# Patient Record
Sex: Male | Born: 1940 | Race: White | Hispanic: No | Marital: Married | Smoking: Never smoker
Health system: Southern US, Community
[De-identification: ages and names within clinical notes are randomized; demographics above are authoritative.]

## PROBLEM LIST (undated history)

## (undated) DIAGNOSIS — I10 Essential (primary) hypertension: Secondary | ICD-10-CM

## (undated) DIAGNOSIS — M1990 Unspecified osteoarthritis, unspecified site: Secondary | ICD-10-CM

## (undated) DIAGNOSIS — C4490 Unspecified malignant neoplasm of skin, unspecified: Secondary | ICD-10-CM

## (undated) DIAGNOSIS — E7800 Pure hypercholesterolemia, unspecified: Secondary | ICD-10-CM

## (undated) DIAGNOSIS — E119 Type 2 diabetes mellitus without complications: Secondary | ICD-10-CM

## (undated) HISTORY — PX: COLON SURGERY: SHX602

## (undated) HISTORY — PX: EYE SURGERY: SHX253

## (undated) HISTORY — DX: Unspecified osteoarthritis, unspecified site: M19.90

---

## 2016-12-13 DIAGNOSIS — D485 Neoplasm of uncertain behavior of skin: Secondary | ICD-10-CM | POA: Diagnosis not present

## 2016-12-13 DIAGNOSIS — L570 Actinic keratosis: Secondary | ICD-10-CM | POA: Diagnosis not present

## 2016-12-13 DIAGNOSIS — Z85828 Personal history of other malignant neoplasm of skin: Secondary | ICD-10-CM | POA: Diagnosis not present

## 2016-12-21 DIAGNOSIS — H35372 Puckering of macula, left eye: Secondary | ICD-10-CM | POA: Diagnosis not present

## 2016-12-21 DIAGNOSIS — E113593 Type 2 diabetes mellitus with proliferative diabetic retinopathy without macular edema, bilateral: Secondary | ICD-10-CM | POA: Diagnosis not present

## 2016-12-21 DIAGNOSIS — H35371 Puckering of macula, right eye: Secondary | ICD-10-CM | POA: Diagnosis not present

## 2017-01-24 DIAGNOSIS — Z85828 Personal history of other malignant neoplasm of skin: Secondary | ICD-10-CM | POA: Diagnosis not present

## 2017-01-24 DIAGNOSIS — L570 Actinic keratosis: Secondary | ICD-10-CM | POA: Diagnosis not present

## 2017-01-24 DIAGNOSIS — Z79899 Other long term (current) drug therapy: Secondary | ICD-10-CM | POA: Diagnosis not present

## 2017-02-10 DIAGNOSIS — E1165 Type 2 diabetes mellitus with hyperglycemia: Secondary | ICD-10-CM | POA: Diagnosis not present

## 2017-02-10 DIAGNOSIS — E119 Type 2 diabetes mellitus without complications: Secondary | ICD-10-CM | POA: Diagnosis not present

## 2017-02-10 DIAGNOSIS — E785 Hyperlipidemia, unspecified: Secondary | ICD-10-CM | POA: Diagnosis not present

## 2017-02-10 DIAGNOSIS — I10 Essential (primary) hypertension: Secondary | ICD-10-CM | POA: Diagnosis not present

## 2017-06-14 DIAGNOSIS — H35372 Puckering of macula, left eye: Secondary | ICD-10-CM | POA: Diagnosis not present

## 2017-06-14 DIAGNOSIS — H35371 Puckering of macula, right eye: Secondary | ICD-10-CM | POA: Diagnosis not present

## 2017-06-14 DIAGNOSIS — E113591 Type 2 diabetes mellitus with proliferative diabetic retinopathy without macular edema, right eye: Secondary | ICD-10-CM | POA: Diagnosis not present

## 2017-06-14 DIAGNOSIS — H35073 Retinal telangiectasis, bilateral: Secondary | ICD-10-CM | POA: Diagnosis not present

## 2017-06-14 DIAGNOSIS — E113512 Type 2 diabetes mellitus with proliferative diabetic retinopathy with macular edema, left eye: Secondary | ICD-10-CM | POA: Diagnosis not present

## 2017-07-28 DIAGNOSIS — Z1283 Encounter for screening for malignant neoplasm of skin: Secondary | ICD-10-CM | POA: Diagnosis not present

## 2017-07-28 DIAGNOSIS — D225 Melanocytic nevi of trunk: Secondary | ICD-10-CM | POA: Diagnosis not present

## 2017-07-28 DIAGNOSIS — L821 Other seborrheic keratosis: Secondary | ICD-10-CM | POA: Diagnosis not present

## 2017-07-28 DIAGNOSIS — L570 Actinic keratosis: Secondary | ICD-10-CM | POA: Diagnosis not present

## 2017-08-15 DIAGNOSIS — E119 Type 2 diabetes mellitus without complications: Secondary | ICD-10-CM | POA: Diagnosis not present

## 2017-08-15 DIAGNOSIS — H35371 Puckering of macula, right eye: Secondary | ICD-10-CM | POA: Diagnosis not present

## 2017-08-15 DIAGNOSIS — H26493 Other secondary cataract, bilateral: Secondary | ICD-10-CM | POA: Diagnosis not present

## 2017-08-15 DIAGNOSIS — Z961 Presence of intraocular lens: Secondary | ICD-10-CM | POA: Diagnosis not present

## 2017-08-25 DIAGNOSIS — E785 Hyperlipidemia, unspecified: Secondary | ICD-10-CM | POA: Diagnosis not present

## 2017-08-25 DIAGNOSIS — Z23 Encounter for immunization: Secondary | ICD-10-CM | POA: Diagnosis not present

## 2017-08-25 DIAGNOSIS — E1165 Type 2 diabetes mellitus with hyperglycemia: Secondary | ICD-10-CM | POA: Diagnosis not present

## 2017-08-25 DIAGNOSIS — E119 Type 2 diabetes mellitus without complications: Secondary | ICD-10-CM | POA: Diagnosis not present

## 2017-08-25 DIAGNOSIS — Z0000 Encounter for general adult medical examination without abnormal findings: Secondary | ICD-10-CM | POA: Diagnosis not present

## 2017-08-25 DIAGNOSIS — Z125 Encounter for screening for malignant neoplasm of prostate: Secondary | ICD-10-CM | POA: Diagnosis not present

## 2017-11-19 ENCOUNTER — Emergency Department: Payer: Medicare Other

## 2017-11-19 ENCOUNTER — Encounter: Payer: Self-pay | Admitting: Emergency Medicine

## 2017-11-19 ENCOUNTER — Other Ambulatory Visit: Payer: Self-pay

## 2017-11-19 ENCOUNTER — Emergency Department
Admission: EM | Admit: 2017-11-19 | Discharge: 2017-11-19 | Disposition: A | Discharge status: Home / Self Care | Payer: Medicare Other | Attending: Emergency Medicine | Admitting: Emergency Medicine

## 2017-11-19 DIAGNOSIS — I10 Essential (primary) hypertension: Secondary | ICD-10-CM | POA: Insufficient documentation

## 2017-11-19 DIAGNOSIS — R110 Nausea: Secondary | ICD-10-CM | POA: Diagnosis not present

## 2017-11-19 DIAGNOSIS — E119 Type 2 diabetes mellitus without complications: Secondary | ICD-10-CM | POA: Diagnosis not present

## 2017-11-19 DIAGNOSIS — R404 Transient alteration of awareness: Secondary | ICD-10-CM | POA: Diagnosis not present

## 2017-11-19 DIAGNOSIS — R42 Dizziness and giddiness: Secondary | ICD-10-CM | POA: Diagnosis not present

## 2017-11-19 HISTORY — DX: Type 2 diabetes mellitus without complications: E11.9

## 2017-11-19 HISTORY — DX: Essential (primary) hypertension: I10

## 2017-11-19 HISTORY — DX: Pure hypercholesterolemia, unspecified: E78.00

## 2017-11-19 HISTORY — DX: Unspecified malignant neoplasm of skin, unspecified: C44.90

## 2017-11-19 LAB — URINALYSIS, COMPLETE (UACMP) WITH MICROSCOPIC
Bacteria, UA: NONE SEEN
Bilirubin Urine: NEGATIVE
GLUCOSE, UA: 50 mg/dL — AB
HGB URINE DIPSTICK: NEGATIVE
Ketones, ur: NEGATIVE mg/dL
Leukocytes, UA: NEGATIVE
NITRITE: NEGATIVE
PH: 7 (ref 5.0–8.0)
PROTEIN: NEGATIVE mg/dL
RBC / HPF: NONE SEEN RBC/hpf (ref 0–5)
SPECIFIC GRAVITY, URINE: 1.005 (ref 1.005–1.030)
Squamous Epithelial / LPF: NONE SEEN

## 2017-11-19 LAB — BASIC METABOLIC PANEL
ANION GAP: 9 (ref 5–15)
BUN: 12 mg/dL (ref 6–20)
CALCIUM: 9.1 mg/dL (ref 8.9–10.3)
CO2: 26 mmol/L (ref 22–32)
CREATININE: 0.84 mg/dL (ref 0.61–1.24)
Chloride: 96 mmol/L — ABNORMAL LOW (ref 101–111)
GLUCOSE: 187 mg/dL — AB (ref 65–99)
Potassium: 4.2 mmol/L (ref 3.5–5.1)
Sodium: 131 mmol/L — ABNORMAL LOW (ref 135–145)

## 2017-11-19 LAB — CBC
HCT: 38.1 % — ABNORMAL LOW (ref 40.0–52.0)
HEMOGLOBIN: 12.9 g/dL — AB (ref 13.0–18.0)
MCH: 29.5 pg (ref 26.0–34.0)
MCHC: 33.9 g/dL (ref 32.0–36.0)
MCV: 86.9 fL (ref 80.0–100.0)
PLATELETS: 212 10*3/uL (ref 150–440)
RBC: 4.38 MIL/uL — AB (ref 4.40–5.90)
RDW: 13 % (ref 11.5–14.5)
WBC: 10.1 10*3/uL (ref 3.8–10.6)

## 2017-11-19 LAB — TROPONIN I: Troponin I: <0.03 ng/mL (ref <0.03)

## 2017-11-19 NOTE — Discharge Instructions (Signed)
The exact cause of your episode is unclear.  It may have been an episode of vertigo caused by her middle ear.  Return to the emergency department immediately for new, worsening, recurrent dizziness, weakness or lightheadedness, headache, severe nausea or vomiting, fevers, generalized weakness, vision changes, acute hearing loss, or any other new or worsening symptoms that concern you.  Follow-up with your primary care doctor within the next week.

## 2017-11-19 NOTE — ED Triage Notes (Signed)
Acute onset of dizziness that has resolved since started.  Described as lightheaded. Has had generalized tremors. No weakness.  No droop. Speech clear.

## 2017-11-19 NOTE — ED Provider Notes (Signed)
[facility] Emergency Department Provider Note ____________________________________________   First MD Initiated Contact with Patient 11/19/17 1841     (approximate)  I have reviewed the triage vital signs and the nursing notes.   HISTORY  Chief Complaint Dizziness    HPI Alexander Peterson is a 77 y.o. male with past medical history as noted below who presents with an acute episode of dizziness, occurring while he was watching TV and sitting, preceded by feeling a "whooshing" sound in his left ear for a few seconds, and then having severe dizziness that he described as feeling "woozy."  The patient states that he may have felt lightheaded, but did not feel like he was going to pass out.  He also described a movement or spinning sensation to me.  He states that the symptoms started to resolve once EMS arrived, and now have completely resolved.  Patient reports associated generalized shaking and tremor, which is also now resolved.  The patient denies any previous history of similar episodes.  He denies any chest pain or difficulty breathing, headache, vomiting or diarrhea, or any weakness or numbness.  He states he did have some nausea.   Past Medical History:  Diagnosis Date  . Diabetes mellitus without complication ([place])   . Hypercholesteremia   . Hypertension   . Skin cancer     There are no active problems to display for this patient.   History reviewed. No pertinent surgical history.  Prior to Admission medications   Not on File    Allergies Patient has no known allergies.  History reviewed. No pertinent family history.  Social History Social History   Tobacco Use  . Smoking status: Never Smoker  . Smokeless tobacco: Never Used  Substance Use Topics  . Alcohol use: No    Frequency: Never  . Drug use: No    Review of Systems  Constitutional: No fever/chills. Eyes: No visual changes. ENT: Negative for ear pain Cardiovascular: Denies  chest pain. Respiratory: Denies shortness of breath. Gastrointestinal: Positive for nausea, no vomiting.  No diarrhea.  Genitourinary: Negative for dysuria.  Musculoskeletal: Negative for back pain. Skin: Negative for rash. Neurological: Negative for headache.   ____________________________________________   PHYSICAL EXAM:  VITAL SIGNS: ED Triage Vitals [11/19/17 1826]  Enc Vitals Group     BP      Pulse      Resp      Temp      Temp src      SpO2      Weight 195 lb (88.5 kg)     Height 5\' 10"  (1.778 m)     Head Circumference      Peak Flow      Pain Score      Pain Loc      Pain Edu?      Excl. in [place]?     Constitutional: Alert and oriented. Well appearing for age and in no acute distress. Eyes: Conjunctivae are normal.  EOMI.  PERRLA. Head: Atraumatic.  TMs appear normal bilaterally. Nose: No congestion/rhinnorhea. Mouth/Throat: Mucous membranes are moist.   Neck: Normal range of motion.  Cardiovascular: Normal rate, regular rhythm. Grossly normal heart sounds.  Good peripheral circulation. Respiratory: Normal respiratory effort.  No retractions. Lungs CTAB. Gastrointestinal: Soft and nontender. No distention.  Genitourinary: No flank tenderness. Musculoskeletal: No lower extremity edema.  Extremities warm and well perfused.  Neurologic:  Normal speech and language.  Motor and sensory intact in all extremities.  Cranial nerves  III through XII intact.  Normal coordination with no ataxia on finger to nose.   Skin:  Skin is warm and dry. No rash noted. Psychiatric: Mood and affect are normal. Speech and behavior are normal.  ____________________________________________   LABS (all labs ordered are listed, but only abnormal results are displayed)  Labs Reviewed  BASIC METABOLIC PANEL - Abnormal; Notable for the following components:      Result Value   Sodium 131 (*)    Chloride 96 (*)    Glucose, Bld 187 (*)    All other components within normal limits  CBC -  Abnormal; Notable for the following components:   RBC 4.38 (*)    Hemoglobin 12.9 (*)    HCT 38.1 (*)    All other components within normal limits  URINALYSIS, COMPLETE (UACMP) WITH MICROSCOPIC - Abnormal; Notable for the following components:   Color, Urine STRAW (*)    APPearance CLEAR (*)    Glucose, UA 50 (*)    All other components within normal limits  TROPONIN I   ____________________________________________  EKG  ED ECG REPORT I, Arta Silence, the attending physician, personally viewed and interpreted this ECG.  Date: 11/19/2017 EKG Time: 1836 Rate: 82 Rhythm: normal sinus rhythm QRS Axis: normal Intervals: Incomplete right bundle branch block ST/T Wave abnormalities: normal Narrative Interpretation: no evidence of acute ischemia; no old EKG available for comparison  ____________________________________________  RADIOLOGY  CT head: No ICH or other acute abnormalities  ____________________________________________   PROCEDURES  Procedure(s) performed: No  Procedures  Critical Care performed: No ____________________________________________   INITIAL IMPRESSION / ASSESSMENT AND PLAN / ED COURSE  Pertinent labs & imaging results that were available during my care of the patient were reviewed by me and considered in my medical decision making (see chart for details).  77 year old male with past medical history as noted above presents with an acute episode of dizziness described both as having a lightheaded or woozy component, as well as a movement sensation.  He also reports generalized tremors.  The symptoms have since resolved.  His blood pressure was noted during transport to be as high as 854 systolic and is still somewhat elevated here, although it was 627 systolic when EMS first checked it.  Past medical records reviewed in Epic and are noncontributory.  On exam, patient is well-appearing, vital signs are normal other than the elevated blood  pressure, the neuro exam is nonfocal, and the remainder the exam is unremarkable.  Overall, patient's presentation does not fit any specific etiology.  It is possible that this could be an atypical episode of peripheral vertigo, given the abnormal sound in the ear prior to it, although it was not associated with motion, and that the associated symptoms do not fit.  It also does not appear consistent with a near syncope.  No evidence of seizure, the patient was conscious throughout the event.  I have a low suspicion for cardiac etiology.  Given that the symptoms have completely resolved and the patient has no headache, there is no evidence of SAH.  No current neuro deficits or strokelike symptoms.  Plan: Basic and cardiac labs, CT head, and reassess.  If negative workup and patient continues to be asymptomatic, anticipate discharge home.    ----------------------------------------- 8:22 PM on 11/19/2017 -----------------------------------------  Lab workup is unremarkable.  Borderline low sodium and chloride are normal for patient.  CT head is negative.  The patient has had no recurrence of his symptoms in the ED, and  his blood pressure has improved on its own.  No evidence of acute CNS cause, or indication for further ED observation or workup.  The patient himself very much would like to go home.  Return precautions given, the patient expresses understanding.  He agrees to follow-up with his primary care doctor.  ____________________________________________   FINAL CLINICAL IMPRESSION(S) / ED DIAGNOSES  Final diagnoses:  Dizziness      NEW MEDICATIONS STARTED DURING THIS VISIT:  New Prescriptions   No medications on file     Note:  This document was prepared using Dragon voice recognition software and may include unintentional dictation errors.    Arta Silence, MD 11/19/17 2023

## 2017-11-22 DIAGNOSIS — R42 Dizziness and giddiness: Secondary | ICD-10-CM | POA: Diagnosis not present

## 2017-11-24 ENCOUNTER — Telehealth: Payer: Self-pay

## 2017-11-24 NOTE — Telephone Encounter (Signed)
[facility] and [facility] for we need a referral sent over for patient. We can't do exams until we see patient .

## 2017-12-01 NOTE — Telephone Encounter (Signed)
[facility] office aware to send new patient referral to schedule new patient appt prior to testing awaiting referral

## 2017-12-06 ENCOUNTER — Ambulatory Visit
Admission: RE | Admit: 2017-12-06 | Discharge: 2017-12-06 | Disposition: A | Discharge status: Home / Self Care | Payer: Medicare Other | Source: Ambulatory Visit | Attending: Family Medicine | Admitting: Family Medicine

## 2017-12-06 ENCOUNTER — Other Ambulatory Visit: Payer: Self-pay | Admitting: Family Medicine

## 2017-12-06 ENCOUNTER — Other Ambulatory Visit: Payer: Medicare Other

## 2017-12-06 ENCOUNTER — Ambulatory Visit (HOSPITAL_BASED_OUTPATIENT_CLINIC_OR_DEPARTMENT_OTHER)
Admission: RE | Admit: 2017-12-06 | Discharge: 2017-12-06 | Disposition: A | Discharge status: Home / Self Care | Payer: Medicare Other | Source: Ambulatory Visit | Admitting: *Deleted

## 2017-12-06 DIAGNOSIS — R55 Syncope and collapse: Secondary | ICD-10-CM

## 2017-12-06 DIAGNOSIS — E119 Type 2 diabetes mellitus without complications: Secondary | ICD-10-CM | POA: Diagnosis not present

## 2017-12-06 DIAGNOSIS — I119 Hypertensive heart disease without heart failure: Secondary | ICD-10-CM | POA: Insufficient documentation

## 2017-12-06 DIAGNOSIS — E7800 Pure hypercholesterolemia, unspecified: Secondary | ICD-10-CM | POA: Insufficient documentation

## 2017-12-06 DIAGNOSIS — I340 Nonrheumatic mitral (valve) insufficiency: Secondary | ICD-10-CM | POA: Diagnosis not present

## 2017-12-06 NOTE — Progress Notes (Signed)
*  PRELIMINARY RESULTS* Echocardiogram 2D Echocardiogram has been performed.  Alexander Peterson 12/06/2017, 2:43 PM

## 2017-12-27 DIAGNOSIS — E113512 Type 2 diabetes mellitus with proliferative diabetic retinopathy with macular edema, left eye: Secondary | ICD-10-CM | POA: Diagnosis not present

## 2017-12-27 DIAGNOSIS — E113591 Type 2 diabetes mellitus with proliferative diabetic retinopathy without macular edema, right eye: Secondary | ICD-10-CM | POA: Diagnosis not present

## 2017-12-27 DIAGNOSIS — H35373 Puckering of macula, bilateral: Secondary | ICD-10-CM | POA: Diagnosis not present

## 2017-12-27 DIAGNOSIS — H35073 Retinal telangiectasis, bilateral: Secondary | ICD-10-CM | POA: Diagnosis not present

## 2017-12-28 DIAGNOSIS — R55 Syncope and collapse: Secondary | ICD-10-CM | POA: Diagnosis not present

## 2017-12-28 DIAGNOSIS — R42 Dizziness and giddiness: Secondary | ICD-10-CM | POA: Diagnosis not present

## 2018-02-20 ENCOUNTER — Emergency Department
Admission: EM | Admit: 2018-02-20 | Discharge: 2018-02-20 | Disposition: A | Discharge status: Home / Self Care | Payer: Medicare Other | Attending: Emergency Medicine | Admitting: Emergency Medicine

## 2018-02-20 ENCOUNTER — Other Ambulatory Visit: Payer: Self-pay

## 2018-02-20 ENCOUNTER — Emergency Department: Payer: Medicare Other

## 2018-02-20 DIAGNOSIS — J3489 Other specified disorders of nose and nasal sinuses: Secondary | ICD-10-CM | POA: Diagnosis not present

## 2018-02-20 DIAGNOSIS — R42 Dizziness and giddiness: Secondary | ICD-10-CM | POA: Insufficient documentation

## 2018-02-20 DIAGNOSIS — I10 Essential (primary) hypertension: Secondary | ICD-10-CM | POA: Diagnosis not present

## 2018-02-20 DIAGNOSIS — R531 Weakness: Secondary | ICD-10-CM

## 2018-02-20 DIAGNOSIS — E871 Hypo-osmolality and hyponatremia: Secondary | ICD-10-CM | POA: Insufficient documentation

## 2018-02-20 DIAGNOSIS — R05 Cough: Secondary | ICD-10-CM | POA: Diagnosis not present

## 2018-02-20 DIAGNOSIS — E119 Type 2 diabetes mellitus without complications: Secondary | ICD-10-CM | POA: Diagnosis not present

## 2018-02-20 LAB — CBC
HCT: 38.5 % — ABNORMAL LOW (ref 40.0–52.0)
Hemoglobin: 13.1 g/dL (ref 13.0–18.0)
MCH: 29.8 pg (ref 26.0–34.0)
MCHC: 34 g/dL (ref 32.0–36.0)
MCV: 87.5 fL (ref 80.0–100.0)
PLATELETS: 198 10*3/uL (ref 150–440)
RBC: 4.41 MIL/uL (ref 4.40–5.90)
RDW: 13.4 % (ref 11.5–14.5)
WBC: 9.5 10*3/uL (ref 3.8–10.6)

## 2018-02-20 LAB — COMPREHENSIVE METABOLIC PANEL
ALT: 15 U/L — AB (ref 17–63)
AST: 26 U/L (ref 15–41)
Albumin: 4 g/dL (ref 3.5–5.0)
Alkaline Phosphatase: 49 U/L (ref 38–126)
Anion gap: 9 (ref 5–15)
BUN: 8 mg/dL (ref 6–20)
CHLORIDE: 91 mmol/L — AB (ref 101–111)
CO2: 26 mmol/L (ref 22–32)
CREATININE: 0.83 mg/dL (ref 0.61–1.24)
Calcium: 8.8 mg/dL — ABNORMAL LOW (ref 8.9–10.3)
GFR calc non Af Amer: >60 mL/min (ref >60)
Glucose, Bld: 192 mg/dL — ABNORMAL HIGH (ref 65–99)
Potassium: 4.4 mmol/L (ref 3.5–5.1)
SODIUM: 126 mmol/L — AB (ref 135–145)
Total Bilirubin: 0.5 mg/dL (ref 0.3–1.2)
Total Protein: 8.1 g/dL (ref 6.5–8.1)

## 2018-02-20 LAB — TROPONIN I: Troponin I: <0.03 ng/mL (ref <0.03)

## 2018-02-20 MED ORDER — AZITHROMYCIN 250 MG PO TABS: 250.0000 mg | ORAL_TABLET | Freq: Every day | ORAL | 0 refills | Status: DC

## 2018-02-20 MED ORDER — AZITHROMYCIN 500 MG PO TABS
500.0000 mg | ORAL_TABLET | Freq: Once | ORAL | Status: AC
  Administered 2018-02-20: 500 mg via ORAL
  Filled 2018-02-20: qty 1

## 2018-02-20 MED ORDER — SODIUM CHLORIDE 0.9 % IV BOLUS
1000.0000 mL | Freq: Once | INTRAVENOUS | Status: AC
  Administered 2018-02-20: 1000 mL via INTRAVENOUS

## 2018-02-20 NOTE — ED Notes (Signed)
Pt up to bedside commode w/o difficulty

## 2018-02-20 NOTE — ED Provider Notes (Signed)
[facility] Emergency Department Provider Note   ____________________________________________   First MD Initiated Contact with Patient 02/20/18 0543     (approximate)  I have reviewed the triage vital signs and the nursing notes.   HISTORY  Chief Complaint Weakness and Dizziness    HPI Alexander Peterson is a 77 y.o. male brought to the ED from home via EMS with a chief complaint of generalized weakness, dizziness and sinus drainage.  Patient reports he awoke at 3 AM to start his day.  Was making his coffee and felt generally weak.  Felt dizzy and dry heaves.  Has been having sinus drainage and nonproductive cough for a week.  Denies associated fever, chills, chest pain, shortness of breath, abdominal pain, vomiting, dysuria, diarrhea.  Denies recent travel or trauma.   Past Medical History:  Diagnosis Date  . Diabetes mellitus without complication ([place])   . Hypercholesteremia   . Hypertension   . Skin cancer     There are no active problems to display for this patient.   History reviewed. No pertinent surgical history.  Prior to Admission medications   Medication Sig Start Date End Date Taking? Authorizing Provider  azithromycin (ZITHROMAX) 250 MG tablet Take 1 tablet (250 mg total) by mouth daily. 02/20/18   Paulette Blanch, MD    Allergies Patient has no known allergies.  No family history on file.  Social History Social History   Tobacco Use  . Smoking status: Never Smoker  . Smokeless tobacco: Never Used  Substance Use Topics  . Alcohol use: No    Frequency: Never  . Drug use: No    Review of Systems  Constitutional: Positive for generalized weakness.  No fever/chills Eyes: No visual changes. ENT: Positive for sinus drainage.  No sore throat. Cardiovascular: Denies chest pain. Respiratory: Positive for nonproductive cough.  Denies shortness of breath. Gastrointestinal: No abdominal pain.  No nausea, no vomiting.  No diarrhea.  No  constipation. Genitourinary: Negative for dysuria. Musculoskeletal: Negative for back pain. Skin: Negative for rash. Neurological: Negative for headaches, focal weakness or numbness.   ____________________________________________   PHYSICAL EXAM:  VITAL SIGNS: ED Triage Vitals  Enc Vitals Group     BP 02/20/18 0440 (!) 162/83     Pulse Rate 02/20/18 0440 (!) 102     Resp 02/20/18 0440 18     Temp 02/20/18 0440 99.1 F (37.3 C)     Temp src --      SpO2 02/20/18 0440 97 %     Weight 02/20/18 0442 194 lb (88 kg)     Height 02/20/18 0442 5\' 10"  (1.778 m)     Head Circumference --      Peak Flow --      Pain Score 02/20/18 0441 6     Pain Loc --      Pain Edu? --      Excl. in [place]? --     Constitutional: Alert and oriented. Well appearing and in no acute distress. Eyes: Conjunctivae are normal. PERRL. EOMI. Head: Atraumatic. Ears: Mild fluid behind both TMs. Nose: No congestion/rhinnorhea. Mouth/Throat: Mucous membranes are moist.  Oropharynx non-erythematous. Neck: No stridor.  No carotid bruits. Cardiovascular: Normal rate, regular rhythm. Grossly normal heart sounds.  Good peripheral circulation. Respiratory: Normal respiratory effort.  No retractions. Lungs CTAB. Gastrointestinal: Soft and nontender. No distention. No abdominal bruits. No CVA tenderness. Musculoskeletal: No lower extremity tenderness nor edema.  No joint effusions. Neurologic:  Normal speech and  language. No gross focal neurologic deficits are appreciated. No gait instability. Skin:  Skin is warm, dry and intact. No rash noted. Psychiatric: Mood and affect are normal. Speech and behavior are normal.  ____________________________________________   LABS (all labs ordered are listed, but only abnormal results are displayed)  Labs Reviewed  CBC - Abnormal; Notable for the following components:      Result Value   HCT 38.5 (*)    All other components within normal limits  COMPREHENSIVE METABOLIC  PANEL - Abnormal; Notable for the following components:   Sodium 126 (*)    Chloride 91 (*)    Glucose, Bld 192 (*)    Calcium 8.8 (*)    ALT 15 (*)    All other components within normal limits  TROPONIN I  TROPONIN I   ____________________________________________  EKG  ED ECG REPORT I, Quantae Martel J, the attending physician, personally viewed and interpreted this ECG.   Date: 02/20/2018  EKG Time: 0447  Rate: 102  Rhythm: sinus tachycardia  Axis: Normal  Intervals:right bundle branch block  ST&T Change: Nonspecific  ____________________________________________  RADIOLOGY  ED MD interpretation: No acute cardiopulmonary process  Official radiology report(s): Dg Chest 2 View  Result Date: 02/20/2018 CLINICAL DATA:  Weakness, dizziness, and sinus drainage for a week. Cough and fever. EXAM: CHEST - 2 VIEW COMPARISON:  None. FINDINGS: Normal heart size and pulmonary vascularity. No focal airspace disease or consolidation in the lungs. No blunting of costophrenic angles. No pneumothorax. Mediastinal contours appear intact. Calcification of the aorta. Degenerative changes in the spine. IMPRESSION: No evidence of active pulmonary disease.  Aortic atherosclerosis. Electronically Signed   By: Lucienne Capers M.D.   On: 02/20/2018 05:09    ____________________________________________   PROCEDURES  Procedure(s) performed: None  Procedures  Critical Care performed: No  ____________________________________________   INITIAL IMPRESSION / ASSESSMENT AND PLAN / ED COURSE  As part of my medical decision making, I reviewed the following data within the electronic MEDICAL RECORD NUMBER History obtained from family, Nursing notes reviewed and incorporated, Labs reviewed, EKG interpreted, Radiograph reviewed and Notes from prior ED visits   77 year old male with diabetes, hypertension, history of hyponatremia who presents with generalized weakness, sinus drainage, dizziness and nausea.   Differential diagnosis includes but is not limited to hyponatremia, dehydration, infection, etc.  Laboratory results remarkable for hyponatremia which is lower than prior.  Will obtain orthostatic vital signs, initiate IV fluids and reassess.   Clinical Course as of Feb 21 716  Mon Feb 20, 2018  6962 Patient orthostatic by pulse rate.  He did not feel dizzy upon standing.  IV fluids infusing.  Will check timed troponin.   [JS]  0708 IV fluids infusing.  Timed troponin to be rechecked.  Anticipate discharge home if troponin is unremarkable and patient is not tachycardic or dizzy on standing.  I think it would be reasonable to discharge him on a Z-Pak given his sinus issues this week.  Care transferred to Dr. Cherylann Banas pending reassessment and disposition.   [JS]    Clinical Course User Index [JS] Paulette Blanch, MD     ____________________________________________   FINAL CLINICAL IMPRESSION(S) / ED DIAGNOSES  Final diagnoses:  Generalized weakness  Hyponatremia  Dizziness  Sinus drainage     ED Discharge Orders        Ordered    azithromycin (ZITHROMAX) 250 MG tablet  Daily     02/20/18 0711       Note:  This document  was prepared using Systems analyst and may include unintentional dictation errors.    Paulette Blanch, MD 02/20/18 [phone]

## 2018-02-20 NOTE — ED Provider Notes (Signed)
-----------------------------------------   9:01 AM on 02/20/2018 -----------------------------------------  I took over care of this patient with Dr. Beather Arbour.  Plan was to await repeat troponin and likely discharge home.  Repeat troponin is negative.  The patient stood and walked around and no longer has dizziness.  He feels well and would like to go home.  A prescription has been provided by Dr. Beather Arbour.  Return precautions given, the patient expresses understanding.   Arta Silence, MD 02/20/18 [phone]

## 2018-02-20 NOTE — ED Triage Notes (Signed)
Patient to ED via [facility] from home for complaints of weakness, dizzy and sinus drainage for a week. Patient is alert and oriented to place, time and events.

## 2018-02-20 NOTE — Discharge Instructions (Addendum)
1.  Finish antibiotic as prescribed (Azithromycin 250 mg daily x4 days).  Start your next dose Tuesday morning. 2.  Drink plenty of fluids daily. 3.  Return to the ER for worsening symptoms, persistent vomiting, difficulty breathing or other concerns.

## 2018-02-22 DIAGNOSIS — E785 Hyperlipidemia, unspecified: Secondary | ICD-10-CM | POA: Diagnosis not present

## 2018-02-22 DIAGNOSIS — I10 Essential (primary) hypertension: Secondary | ICD-10-CM | POA: Diagnosis not present

## 2018-02-22 DIAGNOSIS — E119 Type 2 diabetes mellitus without complications: Secondary | ICD-10-CM | POA: Diagnosis not present

## 2018-03-02 ENCOUNTER — Emergency Department
Admission: EM | Admit: 2018-03-02 | Discharge: 2018-03-02 | Disposition: A | Discharge status: Home / Self Care | Payer: Medicare Other | Attending: Emergency Medicine | Admitting: Emergency Medicine

## 2018-03-02 ENCOUNTER — Encounter: Payer: Self-pay | Admitting: Emergency Medicine

## 2018-03-02 ENCOUNTER — Other Ambulatory Visit: Payer: Self-pay

## 2018-03-02 DIAGNOSIS — Z7982 Long term (current) use of aspirin: Secondary | ICD-10-CM | POA: Diagnosis not present

## 2018-03-02 DIAGNOSIS — Z79899 Other long term (current) drug therapy: Secondary | ICD-10-CM | POA: Insufficient documentation

## 2018-03-02 DIAGNOSIS — I10 Essential (primary) hypertension: Secondary | ICD-10-CM | POA: Diagnosis not present

## 2018-03-02 DIAGNOSIS — R5383 Other fatigue: Secondary | ICD-10-CM | POA: Diagnosis present

## 2018-03-02 DIAGNOSIS — Z794 Long term (current) use of insulin: Secondary | ICD-10-CM | POA: Insufficient documentation

## 2018-03-02 DIAGNOSIS — E119 Type 2 diabetes mellitus without complications: Secondary | ICD-10-CM | POA: Diagnosis not present

## 2018-03-02 DIAGNOSIS — E860 Dehydration: Secondary | ICD-10-CM | POA: Diagnosis not present

## 2018-03-02 DIAGNOSIS — E871 Hypo-osmolality and hyponatremia: Secondary | ICD-10-CM | POA: Diagnosis not present

## 2018-03-02 DIAGNOSIS — E870 Hyperosmolality and hypernatremia: Secondary | ICD-10-CM | POA: Diagnosis not present

## 2018-03-02 LAB — BASIC METABOLIC PANEL
ANION GAP: 11 (ref 5–15)
Anion gap: 9 (ref 5–15)
BUN: 10 mg/dL (ref 6–20)
BUN: 10 mg/dL (ref 6–20)
CALCIUM: 8.2 mg/dL — AB (ref 8.9–10.3)
CHLORIDE: 89 mmol/L — AB (ref 101–111)
CO2: 24 mmol/L (ref 22–32)
CO2: 24 mmol/L (ref 22–32)
CREATININE: 0.91 mg/dL (ref 0.61–1.24)
CREATININE: 0.86 mg/dL (ref 0.61–1.24)
Calcium: 8.8 mg/dL — ABNORMAL LOW (ref 8.9–10.3)
Chloride: 92 mmol/L — ABNORMAL LOW (ref 101–111)
GFR calc Af Amer: >60 mL/min (ref >60)
GFR calc non Af Amer: >60 mL/min (ref >60)
Glucose, Bld: 187 mg/dL — ABNORMAL HIGH (ref 65–99)
Glucose, Bld: 152 mg/dL — ABNORMAL HIGH (ref 65–99)
Potassium: 4.2 mmol/L (ref 3.5–5.1)
Potassium: 4.1 mmol/L (ref 3.5–5.1)
SODIUM: 124 mmol/L — AB (ref 135–145)
SODIUM: 125 mmol/L — AB (ref 135–145)

## 2018-03-02 LAB — URINALYSIS, COMPLETE (UACMP) WITH MICROSCOPIC
Bacteria, UA: NONE SEEN
Bilirubin Urine: NEGATIVE
Hgb urine dipstick: NEGATIVE
KETONES UR: NEGATIVE mg/dL
Leukocytes, UA: NEGATIVE
Nitrite: NEGATIVE
PH: 8 (ref 5.0–8.0)
Protein, ur: NEGATIVE mg/dL
Specific Gravity, Urine: 1.003 — ABNORMAL LOW (ref 1.005–1.030)
Squamous Epithelial / LPF: NONE SEEN (ref 0–5)
WBC, UA: NONE SEEN WBC/hpf (ref 0–5)

## 2018-03-02 LAB — CBC
HCT: 36.6 % — ABNORMAL LOW (ref 40.0–52.0)
HEMOGLOBIN: 12.6 g/dL — AB (ref 13.0–18.0)
MCH: 29.6 pg (ref 26.0–34.0)
MCHC: 34.6 g/dL (ref 32.0–36.0)
MCV: 85.7 fL (ref 80.0–100.0)
Platelets: 325 10*3/uL (ref 150–440)
RBC: 4.27 MIL/uL — AB (ref 4.40–5.90)
RDW: 13 % (ref 11.5–14.5)
WBC: 10.3 10*3/uL (ref 3.8–10.6)

## 2018-03-02 LAB — CORTISOL: Cortisol, Plasma: 10.1 ug/dL

## 2018-03-02 LAB — MAGNESIUM: MAGNESIUM: 1.8 mg/dL (ref 1.7–2.4)

## 2018-03-02 LAB — URIC ACID: URIC ACID, SERUM: 3 mg/dL — AB (ref 4.4–7.6)

## 2018-03-02 LAB — CREATININE, URINE, RANDOM: Creatinine, Urine: 28 mg/dL

## 2018-03-02 LAB — TSH: TSH: 0.636 u[IU]/mL (ref 0.350–4.500)

## 2018-03-02 LAB — SODIUM, URINE, RANDOM: Sodium, Ur: 32 mmol/L

## 2018-03-02 MED ORDER — SODIUM CHLORIDE 0.9 % IV BOLUS
500.0000 mL | Freq: Once | INTRAVENOUS | Status: AC
  Administered 2018-03-02: 500 mL via INTRAVENOUS

## 2018-03-02 NOTE — ED Notes (Signed)
Called and spoke with Lamont in lab and he stated they would run urine at this time.

## 2018-03-02 NOTE — ED Notes (Signed)
Pt given urinal for urine collection 

## 2018-03-02 NOTE — ED Provider Notes (Signed)
[facility] Department Provider Note   ____________________________________________   First MD Initiated Contact with Patient 03/02/18 1504     (approximate)  I have reviewed the triage vital signs and the nursing notes.   HISTORY  Chief Complaint Dehydration   HPI Alexander Peterson is a 77 y.o. male history of diabetes elevated cholesterol and hypertension  Patient reports on Memorial Day became very dehydrated, had a sinus infection was not eating and drinking well.  He came to the ER and his sodium was low and he had to be hydrated.  He is followed up with his primary care doctor to have his sodium rechecked, they checked a couple days ago and it was in the low 120s and today they rechecked it was 121.  Reports he is continue to hydrate, drinking Gatorade, eating and drinking fluids normally.  He does not feel "dehydrated" anymore.  He does feel fatigue.  No muscle twitches or shakes.  No confusion.  No seizure-like activity.  No chest pain or trouble breathing.  His sinus infection he had is cleared up.  He is not a smoker.  No headaches.  No weakness or numbness in one particular area.  No paresthesias.    Past Medical History:  Diagnosis Date  . Diabetes mellitus without complication ([place])   . Hypercholesteremia   . Hypertension   . Skin cancer     There are no active problems to display for this patient.   History reviewed. No pertinent surgical history.  Prior to Admission medications   Medication Sig Start Date End Date Taking? Authorizing Provider  aspirin EC 81 MG tablet Take 81 mg by mouth daily.   Yes [provider]  Calcium Carb-Cholecalciferol (CALCIUM 600+D3) 600-200 MG-UNIT TABS Take 1 tablet by mouth 2 (two) times daily. With lunch and dinner   Yes [provider]  insulin glargine (LANTUS) 100 UNIT/ML injection Inject 0-12 Units into the skin 2 (two) times daily. 12 units every morning and 0-5 units at  bedtime based on blood sugar   Yes [provider]  lisinopril (PRINIVIL,ZESTRIL) 40 MG tablet Take 40 mg by mouth daily. 02/05/18  Yes [provider]  loratadine (CLARITIN) 10 MG tablet Take 10 mg by mouth daily.   Yes [provider]  metFORMIN (GLUCOPHAGE) 1000 MG tablet Take 1,000 mg by mouth 2 (two) times daily. 02/05/18  Yes [provider]  Multiple Vitamin (MULTIVITAMIN) tablet Take 1 tablet by mouth daily.   Yes [provider]  pseudoephedrine (SUDAFED) 120 MG 12 hr tablet Take 120 mg by mouth every 12 (twelve) hours as needed for congestion.   Yes [provider]  simvastatin (ZOCOR) 40 MG tablet Take 40 mg by mouth daily.   Yes [provider]  vitamin E 400 UNIT capsule Take 400 Units by mouth every other day.   Yes [provider]  azithromycin (ZITHROMAX) 250 MG tablet Take 1 tablet (250 mg total) by mouth daily. Patient not taking: Reported on 03/02/2018 02/20/18   Paulette Blanch, MD    Allergies Patient has no known allergies.  History reviewed. No pertinent family history.  Social History Social History   Tobacco Use  . Smoking status: Never Smoker  . Smokeless tobacco: Never Used  Substance Use Topics  . Alcohol use: No    Frequency: Never  . Drug use: No    Review of Systems Constitutional: No fever/chills.  Mild ongoing fatigue. Eyes: No visual changes. ENT:  No sore throat.  Had a sinus infection, this is improving. Cardiovascular: Denies chest pain. Respiratory: Denies shortness of breath. Gastrointestinal: No abdominal pain.  No nausea, no vomiting.  No diarrhea.  No constipation. Genitourinary: Negative for dysuria. Musculoskeletal: Negative for back pain. Skin: Negative for rash. Neurological: Negative for headaches, focal weakness or numbness.   ____________________________________________   PHYSICAL EXAM:  VITAL SIGNS: ED Triage Vitals  Enc Vitals Group     BP 03/02/18 1441  128/86     Pulse Rate 03/02/18 1441 98     Resp 03/02/18 1441 18     Temp 03/02/18 1441 98.2 F (36.8 C)     Temp Source 03/02/18 1441 Oral     SpO2 03/02/18 1441 98 %     Weight 03/02/18 1442 194 lb (88 kg)     Height 03/02/18 1442 5\' 10"  (1.778 m)     Head Circumference --      Peak Flow --      Pain Score 03/02/18 1442 0     Pain Loc --      Pain Edu? --      Excl. in [place]? --     Constitutional: Alert and oriented. Well appearing and in no acute distress.  Holding Gatorade bottle and drinking it. Eyes: Conjunctivae are normal. Head: Atraumatic. Nose: No congestion/rhinnorhea. Mouth/Throat: Mucous membranes are moist. Neck: No stridor.   Cardiovascular: Normal rate, regular rhythm. Grossly normal heart sounds.  Good peripheral circulation. Respiratory: Normal respiratory effort.  No retractions. Lungs CTAB. Gastrointestinal: Soft and nontender. No distention. Musculoskeletal: No lower extremity tenderness nor edema. Neurologic:  Normal speech and language. No gross focal neurologic deficits are appreciated.  Skin:  Skin is warm, dry and intact. No rash noted.  Skin turgor is normal. Psychiatric: Mood and affect are normal. Speech and behavior are normal.  No focal abnormalities on exam.  Overall appears generally euvolemic.  ____________________________________________   LABS (all labs ordered are listed, but only abnormal results are displayed)  Labs Reviewed  CBC - Abnormal; Notable for the following components:      Result Value   RBC 4.27 (*)    Hemoglobin 12.6 (*)    HCT 36.6 (*)    All other components within normal limits  BASIC METABOLIC PANEL - Abnormal; Notable for the following components:   Sodium 124 (*)    Chloride 89 (*)    Glucose, Bld 187 (*)    Calcium 8.8 (*)    All other components within normal limits  URINALYSIS, COMPLETE (UACMP) WITH MICROSCOPIC - Abnormal; Notable for the following components:   Color, Urine STRAW (*)    APPearance CLEAR  (*)    Specific Gravity, Urine 1.003 (*)    Glucose, UA >=500 (*)    All other components within normal limits  URIC ACID - Abnormal; Notable for the following components:   Uric Acid, Serum 3.0 (*)    All other components within normal limits  BASIC METABOLIC PANEL - Abnormal; Notable for the following components:   Sodium 125 (*)    Chloride 92 (*)    Glucose, Bld 152 (*)    Calcium 8.2 (*)    All other components within normal limits  MAGNESIUM  SODIUM, URINE, RANDOM  CREATININE, URINE, RANDOM  TSH  CORTISOL  OSMOLALITY  OSMOLALITY, URINE  CBG MONITORING, ED  CBG MONITORING, ED  CBG MONITORING, ED  CBG MONITORING, ED  CBG MONITORING, ED  CBG MONITORING, ED   ____________________________________________  EKG  Reviewed and entered by me at 1620 Heart rate 85 Cures 120 QTC 500 Normal sinus rhythm, right bundle branch block.  No evidence of ischemia.  Compared with previous EKG from last ED visit no notable change  Of note, patient does not have any cardiopulmonary complaints. ____________________________________________  RADIOLOGY   ____________________________________________   PROCEDURES  Procedure(s) performed: None  Procedures  Critical Care performed: No  ____________________________________________   INITIAL IMPRESSION / ASSESSMENT AND PLAN / ED COURSE  Pertinent labs & imaging results that were available during my care of the patient were reviewed by me and considered in my medical decision making (see chart for details).    Clinical Course as of Mar 02 1940  Thu Mar 02, 2018  1530 Discussed case with Dr. Juleen China, have added on tests at his recommendation for work-up of hyponatremia.   [MQ]  1540 Discussed with Dr. Chrissie Noa office, they referred here as they don't know cause of hyponatremia. Sent to hospital for further evaluation. This is an acute problem. Normally 133-134 sodium in past.    [MQ]  1551 Patient has completed about 500 mL's  of normal saline at this time, I have ordered a repeat chemistry to evaluate for change in sodium.   [MQ]  1618 Discussed with pharmacist, no notable meds that would cause hyponatremia denoted.   [MQ]  1939 Unfortunately our serum a osmolality analyzer is not presently working, labs had to be sent to [place].  Patient sodium has slightly corrected this stabilized after fluids.  I discussed his case now after labs have resulted with both Dr. Juleen China and his primary care physician, both can follow-up closely with him.  His blood pressure is elevated, but primary care doctor reports he is normally running good blood pressures on his checks at the office, advises against additional hypertensive treatment and the patient reports he feels slightly stressed out because of his being here.  He will continue to monitor closely, his primary care doctor will set up a close follow-up visit for the patient early next week for recheck of his sodium.  In the meantime as advised by nephrology, advised patient to drink fluids when thirsty, but not to force additional fluid on himself.  Patient agreeable.  Return precautions and treatment recommendations and follow-up discussed with the patient who is agreeable with the plan.    [MQ]    Clinical Course User Index [MQ] Delman Kitten, MD   Patient presents for evaluation of hyponatremia.  Evidently he has a history that sounds like he had hypovolemic hyponatremia with dehydration recently, but at this point he seems to be hydrating well.  He looks euvolemic, and his sodium level has reportedly continued to drop into the 120s now.  He is continue to hydrate well at home, and differential includes hyponatremia from multiple causes at this point.  Will initiate a broad hyponatremia work-up, have consulted nephrology, await today's lab work and further results.  Will give light IV normal saline for hydration at this  time.  ____________________________________________   FINAL CLINICAL IMPRESSION(S) / ED DIAGNOSES  Final diagnoses:  Hyponatremia  Hypertension, unspecified type      NEW MEDICATIONS STARTED DURING THIS VISIT:  New Prescriptions   No medications on file     Note:  This document was prepared using Dragon voice recognition software and may include unintentional dictation errors.     Delman Kitten, MD 03/02/18 [phone]

## 2018-03-02 NOTE — ED Triage Notes (Signed)
Pt reports that he got dehydrated on Memorial day, his Na+ was 121, the MD sent him home and to try gatoraid, he went back to MD today and they told him that his NA  was only 122 and to come here to get some fluids.

## 2018-03-02 NOTE — ED Notes (Signed)
Called and spoke with [place] concerning urine results still pending. Per Kenney Houseman machine was down and they are working on it. She stated about 15 more minutes til result. MD informed

## 2018-03-03 LAB — OSMOLALITY, URINE: OSMOLALITY UR: 224 mosm/kg — AB (ref 300–900)

## 2018-03-03 LAB — OSMOLALITY: OSMOLALITY: 272 mosm/kg — AB (ref 275–295)

## 2018-03-06 DIAGNOSIS — E870 Hyperosmolality and hypernatremia: Secondary | ICD-10-CM | POA: Diagnosis not present

## 2018-03-06 DIAGNOSIS — E871 Hypo-osmolality and hyponatremia: Secondary | ICD-10-CM | POA: Diagnosis not present

## 2018-03-23 DIAGNOSIS — E871 Hypo-osmolality and hyponatremia: Secondary | ICD-10-CM | POA: Diagnosis not present

## 2018-03-31 DIAGNOSIS — I10 Essential (primary) hypertension: Secondary | ICD-10-CM | POA: Diagnosis not present

## 2018-03-31 DIAGNOSIS — E119 Type 2 diabetes mellitus without complications: Secondary | ICD-10-CM | POA: Diagnosis not present

## 2018-03-31 DIAGNOSIS — E871 Hypo-osmolality and hyponatremia: Secondary | ICD-10-CM | POA: Diagnosis not present

## 2018-04-05 ENCOUNTER — Other Ambulatory Visit: Payer: Self-pay | Admitting: Nephrology

## 2018-04-05 DIAGNOSIS — R634 Abnormal weight loss: Secondary | ICD-10-CM

## 2018-04-05 DIAGNOSIS — R918 Other nonspecific abnormal finding of lung field: Secondary | ICD-10-CM

## 2018-04-05 DIAGNOSIS — E871 Hypo-osmolality and hyponatremia: Secondary | ICD-10-CM

## 2018-04-13 ENCOUNTER — Ambulatory Visit
Admission: RE | Admit: 2018-04-13 | Discharge: 2018-04-13 | Disposition: A | Discharge status: Home / Self Care | Payer: Medicare Other | Source: Ambulatory Visit | Attending: Nephrology | Admitting: Nephrology

## 2018-04-13 DIAGNOSIS — I700 Atherosclerosis of aorta: Secondary | ICD-10-CM | POA: Insufficient documentation

## 2018-04-13 DIAGNOSIS — R918 Other nonspecific abnormal finding of lung field: Secondary | ICD-10-CM

## 2018-04-18 DIAGNOSIS — E119 Type 2 diabetes mellitus without complications: Secondary | ICD-10-CM | POA: Diagnosis not present

## 2018-04-18 DIAGNOSIS — I10 Essential (primary) hypertension: Secondary | ICD-10-CM | POA: Diagnosis not present

## 2018-04-18 DIAGNOSIS — E871 Hypo-osmolality and hyponatremia: Secondary | ICD-10-CM | POA: Diagnosis not present

## 2018-05-15 DIAGNOSIS — E871 Hypo-osmolality and hyponatremia: Secondary | ICD-10-CM | POA: Diagnosis not present

## 2018-06-15 DIAGNOSIS — I10 Essential (primary) hypertension: Secondary | ICD-10-CM | POA: Diagnosis not present

## 2018-06-15 DIAGNOSIS — E871 Hypo-osmolality and hyponatremia: Secondary | ICD-10-CM | POA: Diagnosis not present

## 2018-06-15 DIAGNOSIS — E119 Type 2 diabetes mellitus without complications: Secondary | ICD-10-CM | POA: Diagnosis not present

## 2018-07-27 DIAGNOSIS — Z23 Encounter for immunization: Secondary | ICD-10-CM | POA: Diagnosis not present

## 2018-08-01 DIAGNOSIS — E113512 Type 2 diabetes mellitus with proliferative diabetic retinopathy with macular edema, left eye: Secondary | ICD-10-CM | POA: Diagnosis not present

## 2018-08-01 DIAGNOSIS — H35073 Retinal telangiectasis, bilateral: Secondary | ICD-10-CM | POA: Diagnosis not present

## 2018-08-01 DIAGNOSIS — E113591 Type 2 diabetes mellitus with proliferative diabetic retinopathy without macular edema, right eye: Secondary | ICD-10-CM | POA: Diagnosis not present

## 2018-08-01 DIAGNOSIS — H35372 Puckering of macula, left eye: Secondary | ICD-10-CM | POA: Diagnosis not present

## 2018-08-18 DIAGNOSIS — E113293 Type 2 diabetes mellitus with mild nonproliferative diabetic retinopathy without macular edema, bilateral: Secondary | ICD-10-CM | POA: Diagnosis not present

## 2018-08-18 DIAGNOSIS — H26493 Other secondary cataract, bilateral: Secondary | ICD-10-CM | POA: Diagnosis not present

## 2018-08-18 DIAGNOSIS — Z961 Presence of intraocular lens: Secondary | ICD-10-CM | POA: Diagnosis not present

## 2018-08-18 DIAGNOSIS — H35373 Puckering of macula, bilateral: Secondary | ICD-10-CM | POA: Diagnosis not present

## 2018-08-24 IMAGING — CT CT CHEST W/O CM
2 of 4 series · 15 of 36 positions shown, 18 images · non-contrast
Comparison: None.

CLINICAL DATA: Hyponatremia, weight loss

EXAM:
CT CHEST WITHOUT CONTRAST
TECHNIQUE: Multidetector CT imaging of the chest was performed following the
standard protocol without IV contrast.

[Series 2: chest · axial · 0.72mm/px · z∈[-1264,-978]mm · 12 of 169 slices shown, 15 images (1 of 2)]
[im 13/169  mediastinal]
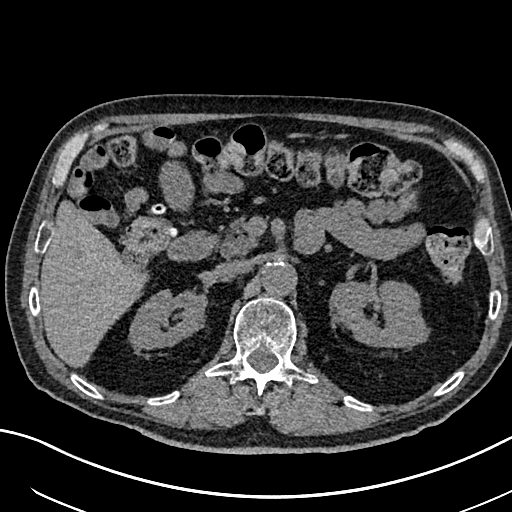
[im 13/169  lung]
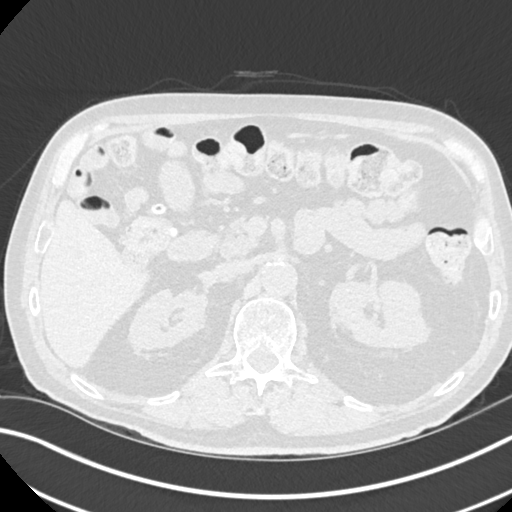
[im 26/169  lung]
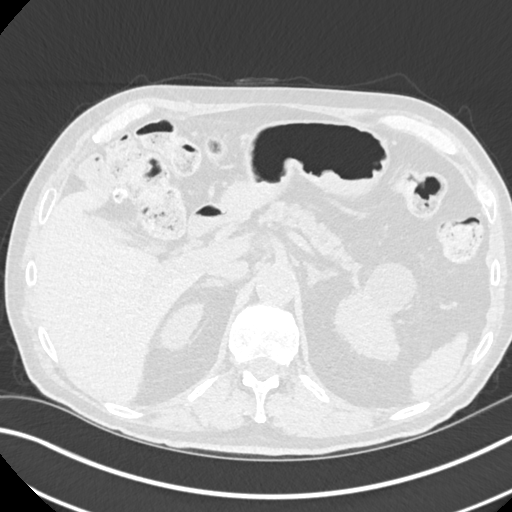
[im 39/169  lung]
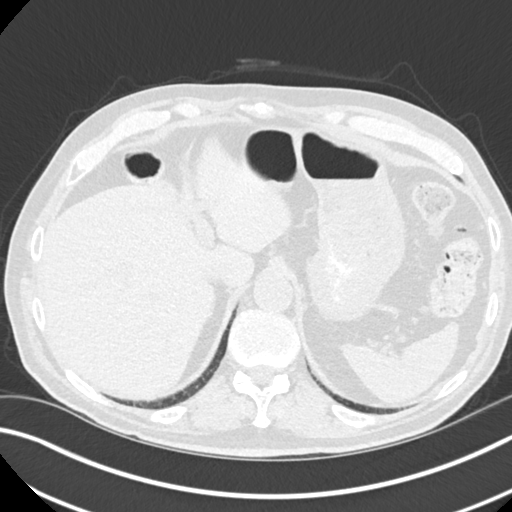
[im 52/169  lung]
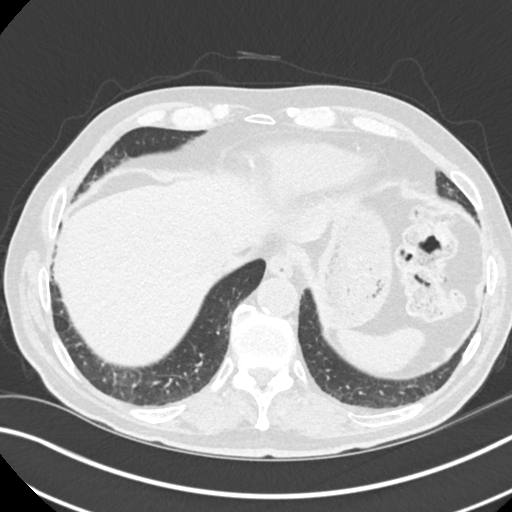
[im 65/169  mediastinal]
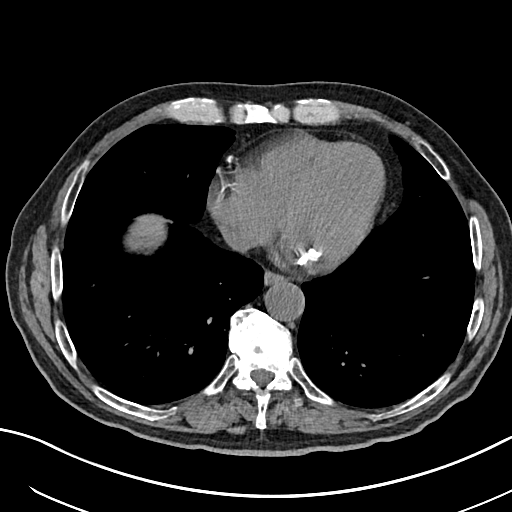
[im 65/169  lung]
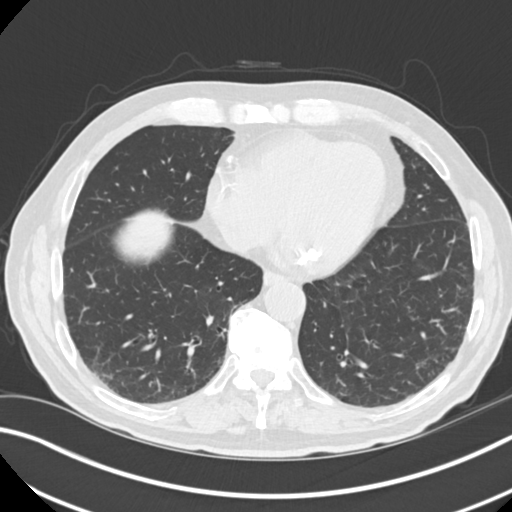
[im 78/169  lung]
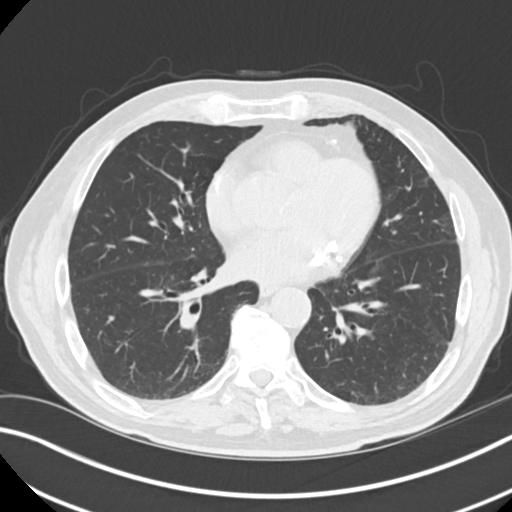
[im 91/169  lung]
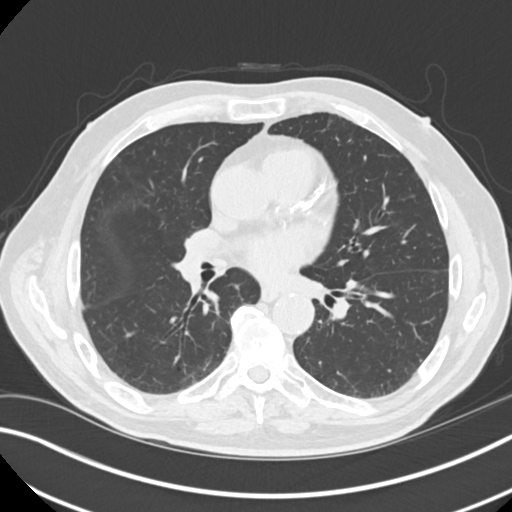
[im 104/169  lung]
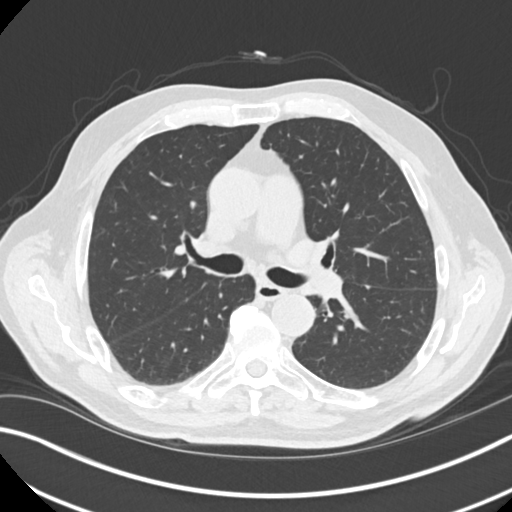
[im 117/169  mediastinal]
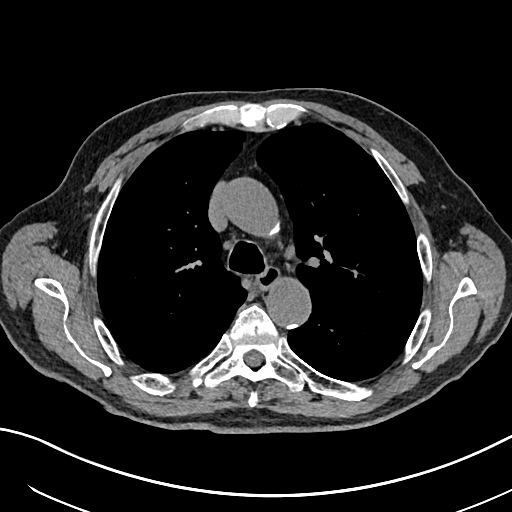
[im 117/169  lung]
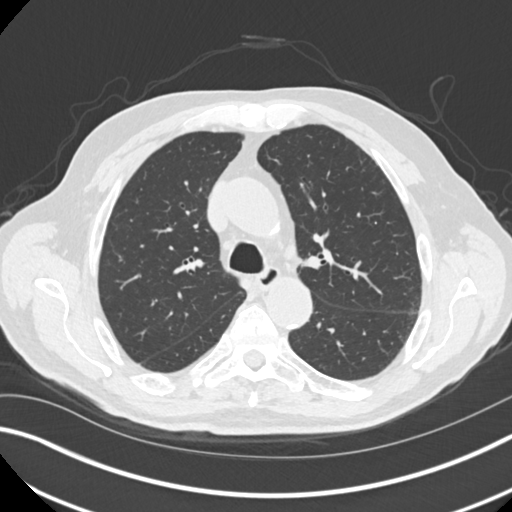
[im 130/169  lung]
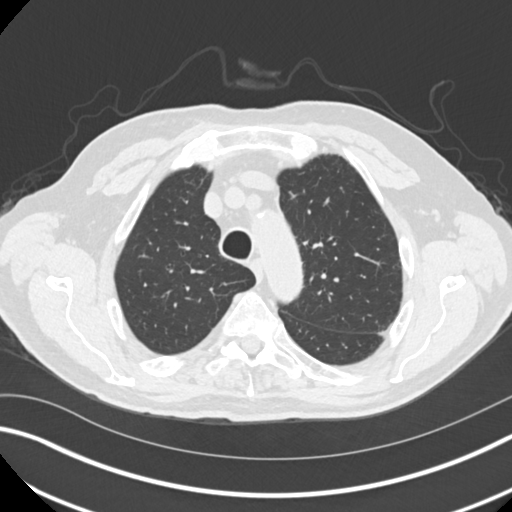
[im 143/169  lung]
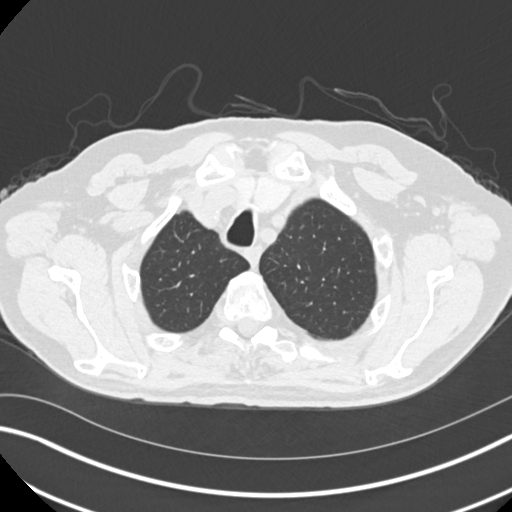
[im 156/169  lung]
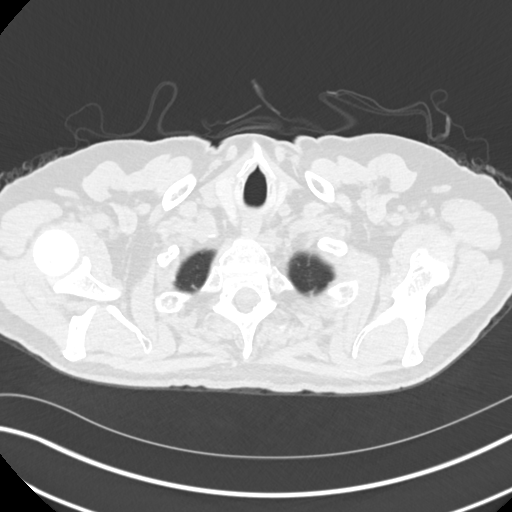

[Series 5: chest · coronal · 0.66mm/px · 3 of 139 slices shown (2 of 2)]
[im 28/139  lung]
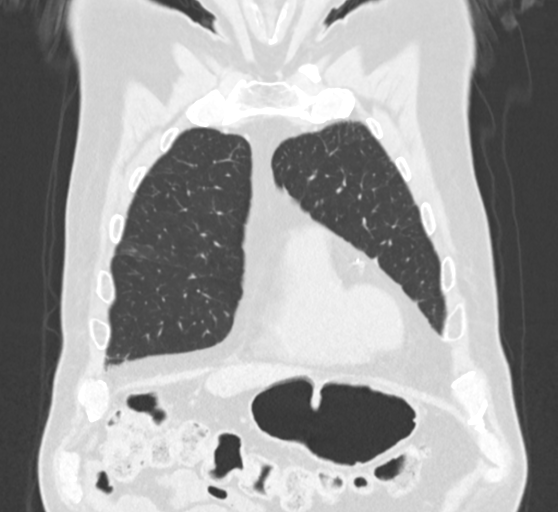
[im 56/139  lung]
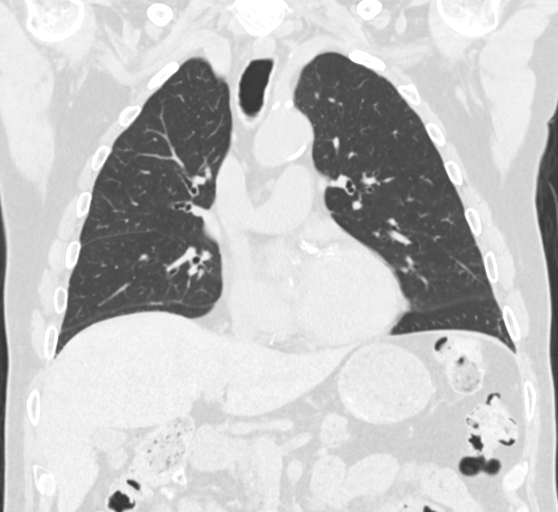
[im 83/139  lung]
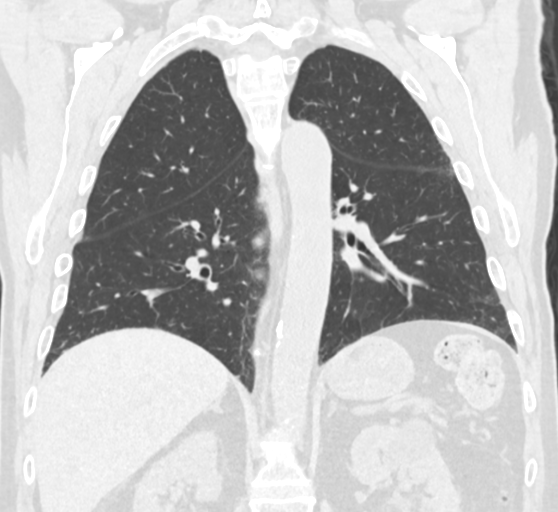

[15 of 36 positions shown; findings below may reference images not displayed]

FINDINGS: Cardiovascular: No significant vascular findings. Normal heart size.
No pericardial effusion. Thoracic aortic atherosclerosis. Coronary
artery atherosclerosis in the LAD, left main, circumflex and right
coronary artery.

Mediastinum/Nodes: No enlarged mediastinal or axillary lymph nodes.
Thyroid gland, trachea, and esophagus demonstrate no significant
findings.

Lungs/Pleura: Lungs are clear. No pleural effusion or pneumothorax.
Mild bibasilar atelectasis.

Upper Abdomen: No acute abnormality.  3.8 cm left renal cyst.

Musculoskeletal: No acute osseous abnormality. No aggressive osseous
lesion.
IMPRESSION: 1. No acute cardiopulmonary disease.
2. No evidence of a lung mass.
3.  Aortic Atherosclerosis ([id number]-150.0).

## 2018-09-01 DIAGNOSIS — D485 Neoplasm of uncertain behavior of skin: Secondary | ICD-10-CM | POA: Diagnosis not present

## 2018-09-01 DIAGNOSIS — C44212 Basal cell carcinoma of skin of right ear and external auricular canal: Secondary | ICD-10-CM | POA: Diagnosis not present

## 2018-09-01 DIAGNOSIS — L821 Other seborrheic keratosis: Secondary | ICD-10-CM | POA: Diagnosis not present

## 2018-09-01 DIAGNOSIS — Z85828 Personal history of other malignant neoplasm of skin: Secondary | ICD-10-CM | POA: Diagnosis not present

## 2018-09-05 DIAGNOSIS — E785 Hyperlipidemia, unspecified: Secondary | ICD-10-CM | POA: Diagnosis not present

## 2018-09-05 DIAGNOSIS — Z0000 Encounter for general adult medical examination without abnormal findings: Secondary | ICD-10-CM | POA: Diagnosis not present

## 2018-09-05 DIAGNOSIS — E119 Type 2 diabetes mellitus without complications: Secondary | ICD-10-CM | POA: Diagnosis not present

## 2018-09-05 DIAGNOSIS — I10 Essential (primary) hypertension: Secondary | ICD-10-CM | POA: Diagnosis not present

## 2018-09-05 DIAGNOSIS — Z125 Encounter for screening for malignant neoplasm of prostate: Secondary | ICD-10-CM | POA: Diagnosis not present

## 2018-09-11 DIAGNOSIS — E113293 Type 2 diabetes mellitus with mild nonproliferative diabetic retinopathy without macular edema, bilateral: Secondary | ICD-10-CM | POA: Diagnosis not present

## 2018-09-27 HISTORY — PX: OTHER SURGICAL HISTORY: SHX169

## 2018-10-13 DIAGNOSIS — H26492 Other secondary cataract, left eye: Secondary | ICD-10-CM | POA: Diagnosis not present

## 2018-11-09 DIAGNOSIS — C44212 Basal cell carcinoma of skin of right ear and external auricular canal: Secondary | ICD-10-CM | POA: Diagnosis not present

## 2019-03-08 DIAGNOSIS — I10 Essential (primary) hypertension: Secondary | ICD-10-CM | POA: Diagnosis not present

## 2019-03-08 DIAGNOSIS — E119 Type 2 diabetes mellitus without complications: Secondary | ICD-10-CM | POA: Diagnosis not present

## 2019-03-08 DIAGNOSIS — E785 Hyperlipidemia, unspecified: Secondary | ICD-10-CM | POA: Diagnosis not present

## 2019-05-16 DIAGNOSIS — K921 Melena: Secondary | ICD-10-CM | POA: Diagnosis not present

## 2019-05-16 DIAGNOSIS — D62 Acute posthemorrhagic anemia: Secondary | ICD-10-CM | POA: Diagnosis present

## 2019-05-16 DIAGNOSIS — E119 Type 2 diabetes mellitus without complications: Secondary | ICD-10-CM | POA: Diagnosis present

## 2019-05-16 DIAGNOSIS — K922 Gastrointestinal hemorrhage, unspecified: Secondary | ICD-10-CM | POA: Diagnosis not present

## 2019-05-16 DIAGNOSIS — R109 Unspecified abdominal pain: Secondary | ICD-10-CM | POA: Diagnosis not present

## 2019-05-16 DIAGNOSIS — K5730 Diverticulosis of large intestine without perforation or abscess without bleeding: Secondary | ICD-10-CM | POA: Diagnosis not present

## 2019-05-16 DIAGNOSIS — I10 Essential (primary) hypertension: Secondary | ICD-10-CM | POA: Diagnosis present

## 2019-05-16 DIAGNOSIS — E785 Hyperlipidemia, unspecified: Secondary | ICD-10-CM | POA: Diagnosis present

## 2019-05-16 DIAGNOSIS — K625 Hemorrhage of anus and rectum: Secondary | ICD-10-CM | POA: Diagnosis not present

## 2019-05-16 DIAGNOSIS — R531 Weakness: Secondary | ICD-10-CM | POA: Diagnosis not present

## 2019-05-16 DIAGNOSIS — K5731 Diverticulosis of large intestine without perforation or abscess with bleeding: Secondary | ICD-10-CM | POA: Diagnosis present

## 2019-05-19 DIAGNOSIS — E785 Hyperlipidemia, unspecified: Secondary | ICD-10-CM | POA: Diagnosis not present

## 2019-05-19 DIAGNOSIS — D62 Acute posthemorrhagic anemia: Secondary | ICD-10-CM | POA: Diagnosis not present

## 2019-05-19 DIAGNOSIS — E119 Type 2 diabetes mellitus without complications: Secondary | ICD-10-CM | POA: Diagnosis not present

## 2019-05-19 DIAGNOSIS — E871 Hypo-osmolality and hyponatremia: Secondary | ICD-10-CM | POA: Diagnosis not present

## 2019-05-19 DIAGNOSIS — I10 Essential (primary) hypertension: Secondary | ICD-10-CM | POA: Diagnosis present

## 2019-05-19 DIAGNOSIS — K921 Melena: Secondary | ICD-10-CM | POA: Diagnosis not present

## 2019-05-19 DIAGNOSIS — K922 Gastrointestinal hemorrhage, unspecified: Secondary | ICD-10-CM | POA: Diagnosis not present

## 2019-05-19 DIAGNOSIS — K5731 Diverticulosis of large intestine without perforation or abscess with bleeding: Secondary | ICD-10-CM | POA: Diagnosis present

## 2019-05-19 DIAGNOSIS — E1165 Type 2 diabetes mellitus with hyperglycemia: Secondary | ICD-10-CM | POA: Diagnosis present

## 2019-05-19 DIAGNOSIS — K559 Vascular disorder of intestine, unspecified: Secondary | ICD-10-CM | POA: Diagnosis not present

## 2019-05-19 DIAGNOSIS — Z794 Long term (current) use of insulin: Secondary | ICD-10-CM | POA: Diagnosis not present

## 2019-05-19 DIAGNOSIS — E876 Hypokalemia: Secondary | ICD-10-CM | POA: Diagnosis present

## 2019-05-19 DIAGNOSIS — G8918 Other acute postprocedural pain: Secondary | ICD-10-CM | POA: Diagnosis not present

## 2019-06-15 DIAGNOSIS — K5732 Diverticulitis of large intestine without perforation or abscess without bleeding: Secondary | ICD-10-CM | POA: Diagnosis not present

## 2019-06-16 DIAGNOSIS — Z23 Encounter for immunization: Secondary | ICD-10-CM | POA: Diagnosis not present

## 2019-07-26 DIAGNOSIS — E119 Type 2 diabetes mellitus without complications: Secondary | ICD-10-CM | POA: Diagnosis not present

## 2019-07-26 DIAGNOSIS — D509 Iron deficiency anemia, unspecified: Secondary | ICD-10-CM | POA: Diagnosis not present

## 2019-07-26 DIAGNOSIS — E789 Disorder of lipoprotein metabolism, unspecified: Secondary | ICD-10-CM | POA: Diagnosis not present

## 2019-08-07 DIAGNOSIS — E113512 Type 2 diabetes mellitus with proliferative diabetic retinopathy with macular edema, left eye: Secondary | ICD-10-CM | POA: Diagnosis not present

## 2019-08-07 DIAGNOSIS — E113591 Type 2 diabetes mellitus with proliferative diabetic retinopathy without macular edema, right eye: Secondary | ICD-10-CM | POA: Diagnosis not present

## 2019-08-07 DIAGNOSIS — H35073 Retinal telangiectasis, bilateral: Secondary | ICD-10-CM | POA: Diagnosis not present

## 2019-08-07 DIAGNOSIS — H35373 Puckering of macula, bilateral: Secondary | ICD-10-CM | POA: Diagnosis not present

## 2019-11-09 DIAGNOSIS — E785 Hyperlipidemia, unspecified: Secondary | ICD-10-CM | POA: Diagnosis not present

## 2019-11-09 DIAGNOSIS — E789 Disorder of lipoprotein metabolism, unspecified: Secondary | ICD-10-CM | POA: Diagnosis not present

## 2019-11-09 DIAGNOSIS — D509 Iron deficiency anemia, unspecified: Secondary | ICD-10-CM | POA: Diagnosis not present

## 2019-11-09 DIAGNOSIS — Z0000 Encounter for general adult medical examination without abnormal findings: Secondary | ICD-10-CM | POA: Diagnosis not present

## 2019-11-09 DIAGNOSIS — E119 Type 2 diabetes mellitus without complications: Secondary | ICD-10-CM | POA: Diagnosis not present

## 2019-11-09 DIAGNOSIS — Z125 Encounter for screening for malignant neoplasm of prostate: Secondary | ICD-10-CM | POA: Diagnosis not present

## 2019-12-24 DIAGNOSIS — H35373 Puckering of macula, bilateral: Secondary | ICD-10-CM | POA: Diagnosis not present

## 2019-12-24 DIAGNOSIS — E113591 Type 2 diabetes mellitus with proliferative diabetic retinopathy without macular edema, right eye: Secondary | ICD-10-CM | POA: Diagnosis not present

## 2019-12-24 DIAGNOSIS — E113512 Type 2 diabetes mellitus with proliferative diabetic retinopathy with macular edema, left eye: Secondary | ICD-10-CM | POA: Diagnosis not present

## 2019-12-24 DIAGNOSIS — Z961 Presence of intraocular lens: Secondary | ICD-10-CM | POA: Diagnosis not present

## 2019-12-24 LAB — HM DIABETES EYE EXAM

## 2020-01-02 DIAGNOSIS — L814 Other melanin hyperpigmentation: Secondary | ICD-10-CM | POA: Diagnosis not present

## 2020-01-02 DIAGNOSIS — D044 Carcinoma in situ of skin of scalp and neck: Secondary | ICD-10-CM | POA: Diagnosis not present

## 2020-01-02 DIAGNOSIS — L821 Other seborrheic keratosis: Secondary | ICD-10-CM | POA: Diagnosis not present

## 2020-01-02 DIAGNOSIS — D485 Neoplasm of uncertain behavior of skin: Secondary | ICD-10-CM | POA: Diagnosis not present

## 2020-01-02 DIAGNOSIS — Z7189 Other specified counseling: Secondary | ICD-10-CM | POA: Diagnosis not present

## 2020-02-12 DIAGNOSIS — E113591 Type 2 diabetes mellitus with proliferative diabetic retinopathy without macular edema, right eye: Secondary | ICD-10-CM | POA: Diagnosis not present

## 2020-02-12 DIAGNOSIS — H35073 Retinal telangiectasis, bilateral: Secondary | ICD-10-CM | POA: Diagnosis not present

## 2020-02-12 DIAGNOSIS — E113512 Type 2 diabetes mellitus with proliferative diabetic retinopathy with macular edema, left eye: Secondary | ICD-10-CM | POA: Diagnosis not present

## 2020-02-12 DIAGNOSIS — H35373 Puckering of macula, bilateral: Secondary | ICD-10-CM | POA: Diagnosis not present

## 2020-02-15 DIAGNOSIS — D0439 Carcinoma in situ of skin of other parts of face: Secondary | ICD-10-CM | POA: Diagnosis not present

## 2020-02-15 DIAGNOSIS — E119 Type 2 diabetes mellitus without complications: Secondary | ICD-10-CM | POA: Diagnosis not present

## 2020-02-15 DIAGNOSIS — L570 Actinic keratosis: Secondary | ICD-10-CM | POA: Diagnosis not present

## 2020-02-15 DIAGNOSIS — D509 Iron deficiency anemia, unspecified: Secondary | ICD-10-CM | POA: Diagnosis not present

## 2020-02-15 DIAGNOSIS — D649 Anemia, unspecified: Secondary | ICD-10-CM | POA: Diagnosis not present

## 2020-06-23 DIAGNOSIS — Z23 Encounter for immunization: Secondary | ICD-10-CM | POA: Diagnosis not present

## 2020-07-30 DIAGNOSIS — Z23 Encounter for immunization: Secondary | ICD-10-CM | POA: Diagnosis not present

## 2020-08-04 DIAGNOSIS — E789 Disorder of lipoprotein metabolism, unspecified: Secondary | ICD-10-CM | POA: Diagnosis not present

## 2020-08-04 DIAGNOSIS — D649 Anemia, unspecified: Secondary | ICD-10-CM | POA: Diagnosis not present

## 2020-08-04 DIAGNOSIS — E785 Hyperlipidemia, unspecified: Secondary | ICD-10-CM | POA: Diagnosis not present

## 2020-08-04 DIAGNOSIS — E119 Type 2 diabetes mellitus without complications: Secondary | ICD-10-CM | POA: Diagnosis not present

## 2020-08-04 DIAGNOSIS — I10 Essential (primary) hypertension: Secondary | ICD-10-CM | POA: Diagnosis not present

## 2020-08-04 DIAGNOSIS — D509 Iron deficiency anemia, unspecified: Secondary | ICD-10-CM | POA: Diagnosis not present

## 2020-08-12 DIAGNOSIS — H35073 Retinal telangiectasis, bilateral: Secondary | ICD-10-CM | POA: Diagnosis not present

## 2020-08-12 DIAGNOSIS — H35372 Puckering of macula, left eye: Secondary | ICD-10-CM | POA: Diagnosis not present

## 2020-08-12 DIAGNOSIS — E113591 Type 2 diabetes mellitus with proliferative diabetic retinopathy without macular edema, right eye: Secondary | ICD-10-CM | POA: Diagnosis not present

## 2020-08-12 DIAGNOSIS — E113512 Type 2 diabetes mellitus with proliferative diabetic retinopathy with macular edema, left eye: Secondary | ICD-10-CM | POA: Diagnosis not present

## 2020-12-25 LAB — HM DIABETES EYE EXAM

## 2022-03-08 LAB — HM DIABETES EYE EXAM

## 2023-03-10 LAB — HM DIABETES EYE EXAM

## 2023-04-08 LAB — HEMOGLOBIN A1C
Albumin, Urine POC: 4.5
Creatinine, POC: 92 mg/dL
EGFR: 63
Hemoglobin A1C: 7.4
PSA, Total: 1.66

## 2023-04-08 LAB — MICROALBUMIN / CREATININE URINE RATIO: Microalb Creat Ratio: 49

## 2023-07-30 ENCOUNTER — Emergency Department
Admission: EM | Admit: 2023-07-30 | Discharge: 2023-07-30 | Disposition: A | Discharge status: Home / Self Care | Payer: Medicare Other | Attending: Emergency Medicine | Admitting: Emergency Medicine

## 2023-07-30 ENCOUNTER — Emergency Department: Payer: Medicare Other

## 2023-07-30 ENCOUNTER — Other Ambulatory Visit: Payer: Self-pay

## 2023-07-30 ENCOUNTER — Encounter: Payer: Self-pay | Admitting: Emergency Medicine

## 2023-07-30 DIAGNOSIS — J0100 Acute maxillary sinusitis, unspecified: Secondary | ICD-10-CM | POA: Diagnosis not present

## 2023-07-30 DIAGNOSIS — E871 Hypo-osmolality and hyponatremia: Secondary | ICD-10-CM | POA: Insufficient documentation

## 2023-07-30 DIAGNOSIS — R42 Dizziness and giddiness: Secondary | ICD-10-CM | POA: Diagnosis present

## 2023-07-30 DIAGNOSIS — H6121 Impacted cerumen, right ear: Secondary | ICD-10-CM | POA: Insufficient documentation

## 2023-07-30 LAB — CBC
HCT: 36.8 % — ABNORMAL LOW (ref 39.0–52.0)
Hemoglobin: 11.9 g/dL — ABNORMAL LOW (ref 13.0–17.0)
MCH: 29.3 pg (ref 26.0–34.0)
MCHC: 32.3 g/dL (ref 30.0–36.0)
MCV: 90.6 fL (ref 80.0–100.0)
Platelets: 178 10*3/uL (ref 150–400)
RBC: 4.06 MIL/uL — ABNORMAL LOW (ref 4.22–5.81)
RDW: 12.7 % (ref 11.5–15.5)
WBC: 8.2 10*3/uL (ref 4.0–10.5)
nRBC: 0 % (ref 0.0–0.2)

## 2023-07-30 LAB — URINALYSIS, ROUTINE W REFLEX MICROSCOPIC
Bacteria, UA: NONE SEEN
Bilirubin Urine: NEGATIVE
Glucose, UA: 50 mg/dL — AB
Hgb urine dipstick: NEGATIVE
Ketones, ur: NEGATIVE mg/dL
Nitrite: NEGATIVE
Protein, ur: NEGATIVE mg/dL
Specific Gravity, Urine: 1.006 (ref 1.005–1.030)
Squamous Epithelial / HPF: 0 /[HPF] (ref 0–5)
pH: 7 (ref 5.0–8.0)

## 2023-07-30 LAB — BASIC METABOLIC PANEL
Anion gap: 9 (ref 5–15)
BUN: 11 mg/dL (ref 8–23)
CO2: 25 mmol/L (ref 22–32)
Calcium: 8.4 mg/dL — ABNORMAL LOW (ref 8.9–10.3)
Chloride: 92 mmol/L — ABNORMAL LOW (ref 98–111)
Creatinine, Ser: 1.05 mg/dL (ref 0.61–1.24)
GFR, Estimated: >60 mL/min (ref >60)
Glucose, Bld: 221 mg/dL — ABNORMAL HIGH (ref 70–99)
Potassium: 4.5 mmol/L (ref 3.5–5.1)
Sodium: 126 mmol/L — ABNORMAL LOW (ref 135–145)

## 2023-07-30 LAB — CBG MONITORING, ED: Glucose-Capillary: 197 mg/dL — ABNORMAL HIGH (ref 70–99)

## 2023-07-30 MED ORDER — FLUTICASONE PROPIONATE 50 MCG/ACT NA SUSP
2.0000 | Freq: Every day | NASAL | 0 refills | Status: DC
Start: 2023-07-30 — End: 2023-10-05

## 2023-07-30 MED ORDER — AMOXICILLIN-POT CLAVULANATE 875-125 MG PO TABS: 1.0000 | ORAL_TABLET | Freq: Two times a day (BID) | ORAL | 0 refills | Status: AC

## 2023-07-30 NOTE — ED Triage Notes (Signed)
Pt to ED via POV from home for dizziness. Pt states that the dizziness started this morning. Pt reports that he has been sick all week with a cold. Pt denies nausea. Pt states that he feels light headed. Pt states that the dizziness does not get worse when he changes positions. Pt was ambulatory to triage without assistance.

## 2023-07-30 NOTE — ED Provider Notes (Signed)
[facility] Provider Note    Event Date/Time   First MD Initiated Contact with Patient 07/30/23 1002     (approximate)   History   Dizziness   HPI  Alexander Peterson is a 82 y.o. male here with dizziness.  The patient states that over the last week, he has had progressively worsening sinus congestion, nasal drainage, mild cough.  He has had significant congestion in his head.  He states that earlier today, he began to feel somewhat "dizzy" which sounds more like slight loss of balance.  No overt vertigo.  Denies any focal numbness or weakness.  No vision changes.  He has had some pressure in his right ear.  He has had some ringing in his right ear.  No other medical complaints.  He has a history of chronic hyponatremia, and states he did drink a little bit more Gatorade than usual yesterday, because of not feeling well.  No other complaints.  No headache.  No tremors.  No seizures.     Physical Exam   Triage Vital Signs: ED Triage Vitals  Encounter Vitals Group     BP 07/30/23 0931 (!) 186/62     Systolic BP Percentile --      Diastolic BP Percentile --      Pulse Rate 07/30/23 0931 86     Resp 07/30/23 0931 18     Temp 07/30/23 0931 98 F (36.7 C)     Temp Source 07/30/23 0931 Oral     SpO2 07/30/23 0931 98 %     Weight 07/30/23 0929 184 lb (83.5 kg)     Height 07/30/23 0929 5\' 10"  (1.778 m)     Head Circumference --      Peak Flow --      Pain Score 07/30/23 0929 0     Pain Loc --      Pain Education --      Exclude from Growth Chart --     Most recent vital signs: Vitals:   07/30/23 1130 07/30/23 1200  BP: (!) 165/63 (!) 175/72  Pulse: 75   Resp: 15 13  Temp:    SpO2: 100% 100%     General: Awake, no distress.  CV:  Good peripheral perfusion.  Regular rate and rhythm.  No murmurs or rubs. Resp:  Normal work of breathing.  Lungs clear to auscultation bilaterally. Abd:  No distention.  No tenderness.  No guarding or  rebound. Other:  Cranial nerves II through XII intact.  Pupils equal, round, and reactive.  No tremors.  Strength out of 5 bilateral upper and lower extremities.  Normal sensation to light touch.  On ENT exam, the patient has significant cerumen impaction of the right ear which was removed.  Tolerated the procedure well.  He has asymmetric right tympanic membrane effusions and significant bulging.  No mastoid tenderness.  Oropharynx clear.   ED Results / Procedures / Treatments   Labs (all labs ordered are listed, but only abnormal results are displayed) Labs Reviewed  BASIC METABOLIC PANEL - Abnormal; Notable for the following components:      Result Value   Sodium 126 (*)    Chloride 92 (*)    Glucose, Bld 221 (*)    Calcium 8.4 (*)    All other components within normal limits  CBC - Abnormal; Notable for the following components:   RBC 4.06 (*)    Hemoglobin 11.9 (*)    HCT 36.8 (*)  All other components within normal limits  URINALYSIS, ROUTINE W REFLEX MICROSCOPIC - Abnormal; Notable for the following components:   Color, Urine YELLOW (*)    APPearance CLEAR (*)    Glucose, UA 50 (*)    Leukocytes,Ua TRACE (*)    All other components within normal limits  CBG MONITORING, ED - Abnormal; Notable for the following components:   Glucose-Capillary 197 (*)    All other components within normal limits     EKG Normal sinus rhythm with frequent PVCs.  Ventricular rate 83.  PR 190, QRS 126, QTc 458.  No acute ST elevations repress.  Acute events of acute ischemia or infarct.   RADIOLOGY CT head: No acute intracranial abnormality   I also independently reviewed and agree with radiologist interpretations.   PROCEDURES:  Critical Care performed: No   .Ear Cerumen Removal  Date/Time: 07/30/2023 12:27 PM  Performed by: Shaune Pollack, MD Authorized by: Shaune Pollack, MD   Consent:    Consent obtained:  Verbal   Consent given by:  Patient   Risks, benefits, and  alternatives were discussed: yes     Risks discussed:  Bleeding, dizziness, incomplete removal, infection, pain and TM perforation   Alternatives discussed:  Alternative treatment Universal protocol:    Procedure explained and questions answered to patient or proxy's satisfaction: yes     Patient identity confirmed:  Verbally with patient Procedure details:    Location:  R ear   Procedure type: curette     Procedure outcomes: cerumen removed   Post-procedure details:    Inspection:  Ear canal clear   Hearing quality:  Diminished   Procedure completion:  Tolerated    MEDICATIONS ORDERED IN ED: Medications - No data to display   IMPRESSION / MDM / ASSESSMENT AND PLAN / ED COURSE  I reviewed the triage vital signs and the nursing notes.                              Differential diagnosis includes, but is not limited to, sinusitis, otitis media, vertigo, stroke, intracranial mass/lesion, [place] hemorrhage, polypharmacy, electrolyte derangement such as hyponatremia or hypokalemia  Patient's presentation is most consistent with acute presentation with potential threat to life or bodily function.  The patient is on the cardiac monitor to evaluate for evidence of arrhythmia and/or significant heart rate changes   82 year old male here with mild dizziness, in the setting of several days of nasal congestion, rhinorrhea, sinus symptoms.  Suspect acute sinusitis with likely middle/inner ear dysfunction, particularly with asymmetric right tympanic membrane bulging.  Patient also had a cerumen impaction which was removed.  No evidence of mastoiditis.  The patient has no nystagmus, dysmetria, dysarthria, or evidence to suggest central etiology, and CT head is negative.  Patient's lab work shows chronic hyponatremia which is at his baseline.  I discussed the case with nephrology as well who confirms that is at his baseline.  He will continue fluid restriction at home.  Otherwise, will give Augmentin,  Flonase to facilitate drainage of his eustachian tube, and discharged with outpatient follow-up.  Patient is very well-appearing, in agreement with this plan and would prefer to manage as an outpatient.  Return precautions given.   FINAL CLINICAL IMPRESSION(S) / ED DIAGNOSES   Final diagnoses:  Dizziness  Acute non-recurrent maxillary sinusitis  Chronic hyponatremia     Rx / DC Orders   ED Discharge Orders  Ordered    amoxicillin-clavulanate (AUGMENTIN) 875-125 MG tablet  2 times daily        07/30/23 1223    fluticasone (FLONASE) 50 MCG/ACT nasal spray  Daily        07/30/23 1223             Note:  This document was prepared using Dragon voice recognition software and may include unintentional dictation errors.   Shaune Pollack, MD 07/30/23 1233

## 2023-07-30 NOTE — Discharge Instructions (Signed)
Continue your usual fluid restriction  Take the Antibiotic for Sinus infection  I'd recommend starting Flonase, a steroid nasal spray.  I have prescribed this but it is also available over-the-counter.  Take this daily.  This will help drain your inner ear.  Follow-up with your doctor in the next week for repeat labs and checkup.

## 2023-09-15 LAB — HM DIABETES EYE EXAM

## 2023-10-05 ENCOUNTER — Ambulatory Visit (INDEPENDENT_AMBULATORY_CARE_PROVIDER_SITE_OTHER): Payer: Medicare Other | Admitting: Family Medicine

## 2023-10-05 ENCOUNTER — Encounter (HOSPITAL_BASED_OUTPATIENT_CLINIC_OR_DEPARTMENT_OTHER): Payer: Self-pay | Admitting: Family Medicine

## 2023-10-05 VITALS — BP 178/75 | HR 83 | Ht 70.0 in | Wt 178.8 lb

## 2023-10-05 DIAGNOSIS — Z794 Long term (current) use of insulin: Secondary | ICD-10-CM | POA: Diagnosis not present

## 2023-10-05 DIAGNOSIS — I10 Essential (primary) hypertension: Secondary | ICD-10-CM | POA: Insufficient documentation

## 2023-10-05 DIAGNOSIS — E785 Hyperlipidemia, unspecified: Secondary | ICD-10-CM | POA: Insufficient documentation

## 2023-10-05 DIAGNOSIS — E1121 Type 2 diabetes mellitus with diabetic nephropathy: Secondary | ICD-10-CM | POA: Diagnosis not present

## 2023-10-05 DIAGNOSIS — Z0000 Encounter for general adult medical examination without abnormal findings: Secondary | ICD-10-CM

## 2023-10-05 DIAGNOSIS — E119 Type 2 diabetes mellitus without complications: Secondary | ICD-10-CM | POA: Insufficient documentation

## 2023-10-05 NOTE — Assessment & Plan Note (Signed)
 Tolerating simvastatin well, can continue with medication at this time.  He will be completing labs next week and we can follow-up on these results

## 2023-10-05 NOTE — Assessment & Plan Note (Signed)
 Blood pressure borderline in office today, can continue with current regimen and we can monitor blood pressure moving forward.  Recommend intermittent monitoring of blood pressure at home, DASH diet.

## 2023-10-05 NOTE — Assessment & Plan Note (Signed)
 Recent hemoglobin A1c at goal at 7.4%.  Discussed that for patient, given age and history and medications being utilized, would want to keep A1c less than 7.5%.  Can continue with current medications. Recent labs also noted moderately increased albuminuria.  Given this, patient would be candidate for initiation of SGLT2 inhibitor, did discuss this related to kidney and heart protection.  We did review this class of medication and potential side effects/adverse effects which can occur with this class.  Patient to consider, can consider starting medication in the future if desired

## 2023-10-05 NOTE — Patient Instructions (Signed)
  Medication Instructions:  Your physician recommends that you continue on your current medications as directed. Please refer to the Current Medication list given to you today. --If you need a refill on any your medications before your next appointment, please call your pharmacy first. If no refills are authorized on file call the office.-- Lab Work: Your physician has recommended that you have lab work today: 1 week before next visit  If you have labs (blood work) drawn today and your tests are completely normal, you will receive your results via MyChart message OR a phone call from our staff.  Please ensure you check your voicemail in the event that you authorized detailed messages to be left on a delegated number. If you have any lab test that is abnormal or we need to change your treatment, we will call you to review the results.  Follow-Up: Your next appointment:   Your physician recommends that you schedule a follow-up appointment in: 6 month physical with Dr. de [place]  You will receive a text message or e-mail with a link to a survey about your care and experience with [place] today! We would greatly appreciate your feedback!   Thanks for letting us be apart of your health journey!!  Primary Care and Sports Medicine   Dr. Ceasar Mons [place]   We encourage you to activate your patient portal called "MyChart".  Sign up information is provided on this After Visit Summary.  MyChart is used to connect with patients for Virtual Visits (Telemedicine).  Patients are able to view lab/test results, encounter notes, upcoming appointments, etc.  Non-urgent messages can be sent to your provider as well. To learn more about what you can do with MyChart, please visit --  [email or web address].

## 2023-10-05 NOTE — Progress Notes (Signed)
 New Patient Office Visit  Subjective   Patient ID: Alexander Peterson, male    DOB: 1941-01-23  Age: 83 y.o. MRN: [id number]  CC:  Chief Complaint  Patient presents with   New Patient (Initial Visit)    New patient just establishing care    HPI Alexander Peterson presents to establish care Last PCP - Dr. Lamar Music in [place], [place]  DM: Initially diagnosed in 1988.  Current medications include Lantus and metformin.  He is currently administering 17 units of Lantus daily.  He does check his blood sugar twice daily, however initial reading is in the morning after eating first meal.  He occasionally will have symptomatic low blood sugar, however cannot recall last time that this happened.  He typically will keep something sugary on hand in case this does happen.  He did have recent labs through his prior PCP about 6 months ago, plans to recheck labs next week.  Most recent hemoglobin A1c was 7.4%.  A1c 7.4% 04/08/23 Albumin/Cr 49 04/08/23  HTN: Currently taking lisinopril 40 mg once daily.  Denies any issues with medication.  No reported concerns with chest pain or headaches.  HLD: Is currently taking simvastatin, has not had any issues with medication, no reported concerns with myalgias.  Recent episode of dizziness in November.  Did present to emergency department related to this.  Had labs and EKG performed.  Reports being told about possible murmur at that time.  EKG with PVC, RBBB noted.  He was discharged with outpatient follow-up  He does recall a nephrologist in the past, this was around time of COVID, did not have consistent follow-up.  He does recall being told about being on fluid restriction.  On review of chart, this likely was related to observed chronic hyponatremia.  Patient [place] , [place].  Has been living here for about 20 years.  Outpatient Encounter Medications as of 10/05/2023  Medication Sig   ACCU-CHEK AVIVA PLUS test strip 2 (two) times daily.    Calcium Carb-Cholecalciferol (CALCIUM 600+D3) 600-200 MG-UNIT TABS Take 1 tablet by mouth 2 (two) times daily. With lunch and dinner   cyanocobalamin (VITAMIN B12) 500 MCG tablet Take 2,500 mcg by mouth daily.   ferrous sulfate 325 (65 FE) MG tablet Take 325 mg by mouth daily with breakfast.   glucosamine-chondroitin 500-400 MG tablet Take 1 tablet by mouth 3 (three) times daily.   insulin glargine (LANTUS) 100 UNIT/ML injection Inject 0-12 Units into the skin 2 (two) times daily. 12 units every morning and 0-5 units at bedtime based on blood sugar   lisinopril (PRINIVIL,ZESTRIL) 40 MG tablet Take 40 mg by mouth daily.   metFORMIN (GLUCOPHAGE) 1000 MG tablet Take 1,000 mg by mouth 2 (two) times daily.   Multiple Vitamin (MULTIVITAMIN) tablet Take 1 tablet by mouth daily.   Multiple Vitamins-Minerals (ICAPS AREDS 2 PO) Take by mouth.   simvastatin (ZOCOR) 40 MG tablet Take 40 mg by mouth daily.   vitamin E 400 UNIT capsule Take 400 Units by mouth every other day.   [DISCONTINUED] aspirin EC 81 MG tablet Take 81 mg by mouth daily.   [DISCONTINUED] azithromycin  (ZITHROMAX ) 250 MG tablet Take 1 tablet (250 mg total) by mouth daily. (Patient not taking: Reported on 03/02/2018)   [DISCONTINUED] fluticasone  (FLONASE ) 50 MCG/ACT nasal spray Place 2 sprays into both nostrils daily. (Patient not taking: Reported on 10/05/2023)   [DISCONTINUED] loratadine (CLARITIN) 10 MG tablet Take 10 mg by mouth daily. (Patient not  taking: Reported on 10/05/2023)   [DISCONTINUED] pseudoephedrine (SUDAFED) 120 MG 12 hr tablet Take 120 mg by mouth every 12 (twelve) hours as needed for congestion. (Patient not taking: Reported on 10/05/2023)   No facility-administered encounter medications on file as of 10/05/2023.    Past Medical History:  Diagnosis Date   Diabetes mellitus without complication (HCC)    Hypercholesteremia    Hypertension    Skin cancer     Past Surgical History:  Procedure Laterality Date   upper  instetine removed  2020    History reviewed. No pertinent family history.  Social History   Socioeconomic History   Marital status: Married    Spouse name: Not on file   Number of children: Not on file   Years of education: Not on file   Highest education level: Not on file  Occupational History   Not on file  Tobacco Use   Smoking status: Never    Passive exposure: Never   Smokeless tobacco: Never  Vaping Use   Vaping status: Never Used  Substance and Sexual Activity   Alcohol use: No   Drug use: No   Sexual activity: Not on file  Other Topics Concern   Not on file  Social History Narrative   Not on file   Social Drivers of Health   Financial Resource Strain: Not on file  Food Insecurity: Not on file  Transportation Needs: Not on file  Physical Activity: Not on file  Stress: Not on file  Social Connections: Not on file  Intimate Partner Violence: Not on file    Objective   BP (!) 178/75 (BP Location: Right Arm, Patient Position: Sitting, Cuff Size: Normal)   Pulse 83   Ht 5' 10 (1.778 m)   Wt 178 lb 12.8 oz (81.1 kg)   SpO2 100%   BMI 25.66 kg/m   Physical Exam  83 year old male in no acute distress Cardiovascular exam with regular rate and rhythm, no murmur appreciated Lungs clear to auscultation bilaterally  Assessment & Plan:   Type 2 diabetes mellitus with diabetic nephropathy, with long-term current use of insulin (HCC) Assessment & Plan: Recent hemoglobin A1c at goal at 7.4%.  Discussed that for patient, given age and history and medications being utilized, would want to keep A1c less than 7.5%.  Can continue with current medications. Recent labs also noted moderately increased albuminuria.  Given this, patient would be candidate for initiation of SGLT2 inhibitor, did discuss this related to kidney and heart protection.  We did review this class of medication and potential side effects/adverse effects which can occur with this class.  Patient to  consider, can consider starting medication in the future if desired  Orders: -     Hemoglobin A1c; Future  Primary hypertension Assessment & Plan: Blood pressure borderline in office today, can continue with current regimen and we can monitor blood pressure moving forward.  Recommend intermittent monitoring of blood pressure at home, DASH diet.  Orders: -     CBC with Differential/Platelet; Future  Hyperlipidemia, unspecified hyperlipidemia type Assessment & Plan: Tolerating simvastatin well, can continue with medication at this time.  He will be completing labs next week and we can follow-up on these results   Wellness examination -     CBC with Differential/Platelet; Future -     Comprehensive metabolic panel; Future -     Hemoglobin A1c; Future -     Lipid panel; Future -     TSH Rfx on Abnormal  to Free T4; Future  Return in about 6 months (around 04/03/2024) for CPE with fasting labs 1 week prior.    ___________________________________________ Alexander Wise [place], MD, ABFM, CAQSM Primary Care and Sports Medicine [facility]

## 2023-10-11 LAB — LAB REPORT - SCANNED
A1c: 8.1
EGFR (Non-African Amer.): 73

## 2023-10-17 ENCOUNTER — Encounter (HOSPITAL_BASED_OUTPATIENT_CLINIC_OR_DEPARTMENT_OTHER): Payer: Self-pay

## 2023-10-18 NOTE — Progress Notes (Deleted)
Cardiology Office Note:    Date:  10/18/2023   ID:  Alexander Peterson 1941-06-29, MRN [id number]  PCP:  [place], Alexander Peterson   [facility] Providers Cardiologist:  None { Click to update primary Peterson,subspecialty Peterson or APP then REFRESH:1}    Referring Peterson: Alexander Frisk, Peterson   No chief complaint on file. ***  History of Present Illness:    Alexander Peterson is a 83 y.o. male is self referred for evaluation of irregular heart beat. He has a history of DM type 2, HTN, and HLD. Echo done in 2019 was normal. Prior CT chest in 2019 did show evidence of coronary and aortic calcification.   Past Medical History:  Diagnosis Date   Diabetes mellitus without complication (HCC)    Hypercholesteremia    Hypertension    Skin cancer     Past Surgical History:  Procedure Laterality Date   upper instetine removed  2020    Current Medications: No outpatient medications have been marked as taking for the 10/19/23 encounter (Appointment) with [place], Velva Molinari M, Peterson.     Allergies:   Patient has no known allergies.   Social History   Socioeconomic History   Marital status: Married    Spouse name: Not on file   Number of children: Not on file   Years of education: Not on file   Highest education level: Not on file  Occupational History   Not on file  Tobacco Use   Smoking status: Never    Passive exposure: Never   Smokeless tobacco: Never  Vaping Use   Vaping status: Never Used  Substance and Sexual Activity   Alcohol use: No   Drug use: No   Sexual activity: Not on file  Other Topics Concern   Not on file  Social History Narrative   Not on file   Social Drivers of Health   Financial Resource Strain: Not on file  Food Insecurity: Not on file  Transportation Needs: Not on file  Physical Activity: Not on file  Stress: Not on file  Social Connections: Not on file     Family History: The patient's ***family history is not on file.  ROS:   Please see the history of  present illness.    *** All other systems reviewed and are negative.  EKGs/Labs/Other Studies Reviewed:    The following studies were reviewed today: Echo 12/06/17: Study Conclusions   - Left ventricle: The cavity size was normal. Wall thickness was    increased in a pattern of mild LVH. Systolic function was normal.    The estimated ejection fraction was in the range of 60% to 65%.    Wall motion was normal; there were no regional wall motion    abnormalities. Doppler parameters are consistent with abnormal    left ventricular relaxation (grade 1 diastolic dysfunction).  - Aortic valve: Mildly thickened leaflets.  - Mitral valve: Calcified annulus. There was mild regurgitation.  - Right ventricle: The cavity size was normal. Wall thickness was    normal. Systolic function was normal.        Recent Labs: 07/30/2023: BUN 11; Creatinine, Ser 1.05; Hemoglobin 11.9; Platelets 178; Potassium 4.5; Sodium 126  Recent Lipid Panel No results found for: "CHOL", "TRIG", "HDL", "CHOLHDL", "VLDL", "LDLCALC", "LDLDIRECT"   Risk Assessment/Calculations:   {Does this patient have ATRIAL FIBRILLATION?[phone]}  No BP recorded.  {Refresh Note OR Click here to enter BP  :1}***  Physical Exam:    VS:  There were no vitals taken for this visit.    Wt Readings from Last 3 Encounters:  10/05/23 178 lb 12.8 oz (81.1 kg)  07/30/23 184 lb (83.5 kg)  03/02/18 194 lb (88 kg)     GEN: *** Well nourished, well developed in no acute distress HEENT: Normal NECK: No JVD; No carotid bruits LYMPHATICS: No lymphadenopathy CARDIAC: ***RRR, no murmurs, rubs, gallops RESPIRATORY:  Clear to auscultation without rales, wheezing or rhonchi  ABDOMEN: Soft, non-tender, non-distended MUSCULOSKELETAL:  No edema; No deformity  SKIN: Warm and dry NEUROLOGIC:  Alert and oriented x 3 PSYCHIATRIC:  Normal affect   ASSESSMENT:    No diagnosis found. PLAN:    In order of problems listed  above:  ***      {Are you ordering a CV Procedure (e.g. stress test, cath, DCCV, TEE, etc)?   Press F2        :[id number]}    Medication Adjustments/Labs and Tests Ordered: Current medicines are reviewed at length with the patient today.  Concerns regarding medicines are outlined above.  No orders of the defined types were placed in this encounter.  No orders of the defined types were placed in this encounter.   There are no Patient Instructions on file for this visit.   Signed, Merrick Feutz [place], Peterson  10/18/2023 7:50 AM    [facility]

## 2023-10-19 ENCOUNTER — Encounter: Payer: Self-pay | Admitting: *Deleted

## 2023-10-19 ENCOUNTER — Ambulatory Visit: Payer: Medicare Other | Admitting: Cardiology

## 2023-10-19 ENCOUNTER — Ambulatory Visit (HOSPITAL_BASED_OUTPATIENT_CLINIC_OR_DEPARTMENT_OTHER): Payer: Medicare Other | Admitting: Cardiology

## 2023-10-19 NOTE — Telephone Encounter (Signed)
Spoke with patient regarding appointment cancellation due to inclement weather. He is aware that a scheduler will contact him later today to reschedule appointment.

## 2023-10-21 NOTE — Telephone Encounter (Signed)
Spoke with patient and wife. Able to move up appointment to 10/24/23 at 2:20pm. Pt and wife verbalize understanding and agreement with plan.

## 2023-10-24 ENCOUNTER — Encounter: Payer: Self-pay | Admitting: Cardiology

## 2023-10-24 ENCOUNTER — Ambulatory Visit: Payer: Medicare Other | Attending: Cardiology | Admitting: Cardiology

## 2023-10-24 VITALS — BP 132/64 | HR 102 | Ht 70.0 in | Wt 178.0 lb

## 2023-10-24 DIAGNOSIS — I493 Ventricular premature depolarization: Secondary | ICD-10-CM | POA: Insufficient documentation

## 2023-10-24 DIAGNOSIS — I10 Essential (primary) hypertension: Secondary | ICD-10-CM | POA: Diagnosis present

## 2023-10-24 DIAGNOSIS — E785 Hyperlipidemia, unspecified: Secondary | ICD-10-CM | POA: Diagnosis present

## 2023-10-24 NOTE — Patient Instructions (Signed)
Medication Instructions:  Your physician recommends that you continue on your current medications as directed. Please refer to the Current Medication list given to you today.  *If you need a refill on your cardiac medications before your next appointment, please call your pharmacy*   Follow-Up: At [facility], you and your health needs are our priority.  As part of our continuing mission to provide you with exceptional heart care, we have created designated Provider Care Teams.  These Care Teams include your primary Cardiologist (physician) and Advanced Practice Providers (APPs -  Physician Assistants and Nurse Practitioners) who all work together to provide you with the care you need, when you need it.  We recommend signing up for the patient portal called "MyChart".  Sign up information is provided on this After Visit Summary.  MyChart is used to connect with patients for Virtual Visits (Telemedicine).  Patients are able to view lab/test results, encounter notes, upcoming appointments, etc.  Non-urgent messages can be sent to your provider as well.   To learn more about what you can do with MyChart, go to [email or web address].    Your next appointment:   As needed  Provider:   Dr. [place]

## 2023-10-24 NOTE — Progress Notes (Signed)
**Note Alexander-Identified via Obfuscation** Cardiology Office Note:    Date:  10/24/2023   ID:  Alexander Peterson, [name] 29-Sep-1940, MRN [id number]  PCP:  Alexander [place], Raymond J, MD   [facility] Providers Cardiologist:  None     Referring MD: Storm Frisk, MD   Chief Complaint  Patient presents with   Palpitations    History of Present Illness:    Alexander Peterson is a 83 y.o. male is self referred for evaluation of irregular heart beat. He has a history of DM type 2, HTN, and HLD. Echo done in 2019 was normal. Prior CT chest in 2019 did show evidence of coronary and aortic calcification. He was seen in the ED in November with dehydration. Noted at that time to have PVCs. These were completely asymptomatic. He in fact denies any chest pain, dizziness, palpitations or dyspnea. He does have some chronic fatigue.   Past Medical History:  Diagnosis Date   Diabetes mellitus without complication (HCC)    Hypercholesteremia    Hypertension    Skin cancer     Past Surgical History:  Procedure Laterality Date   upper instetine removed  2020    Current Medications: No outpatient medications have been marked as taking for the 10/24/23 encounter (Office Visit) with [place], Iretha Kirley M, MD.     Allergies:   Patient has no known allergies.   Social History   Socioeconomic History   Marital status: Married    Spouse name: Not on file   Number of children: 2   Years of education: Not on file   Highest education level: Not on file  Occupational History   Not on file  Tobacco Use   Smoking status: Never    Passive exposure: Never   Smokeless tobacco: Never  Vaping Use   Vaping status: Never Used  Substance and Sexual Activity   Alcohol use: No   Drug use: No   Sexual activity: Not on file  Other Topics Concern   Not on file  Social History Narrative   Retired Surveyor, minerals   Social Drivers of Corporate investment banker Strain: Not on file  Food Insecurity: Not on file  Transportation Needs: Not on file   Physical Activity: Not on file  Stress: Not on file  Social Connections: Not on file     Family History: The patient's family history includes Cancer in his mother; Heart attack in his mother; Heart disease in his mother; Stroke in his sister.  ROS:   Please see the history of present illness.     All other systems reviewed and are negative.  EKGs/Labs/Other Studies Reviewed:    The following studies were reviewed today:  EKG Interpretation Date/Time:  Monday October 24 2023 14:08:01 EST Ventricular Rate:  102 PR Interval:  184 QRS Duration:  118 QT Interval:  350 QTC Calculation: 456 R Axis:   -11  Text Interpretation: Sinus tachycardia Possible Left atrial enlargement Right bundle branch block When compared with ECG of 30-Jul-2023 09:30, Premature ventricular complexes are no longer Present Confirmed by [place], Alexander Peterson [phone]) on 10/24/2023 2:09:42 PM   Echo 12/06/17: Study Conclusions   - Left ventricle: The cavity size was normal. Wall thickness was    increased in a pattern of mild LVH. Systolic function was normal.    The estimated ejection fraction was in the range of 60% to 65%.    Wall motion was normal; there were no regional wall motion    abnormalities. Doppler parameters are consistent with  abnormal    left ventricular relaxation (grade 1 diastolic dysfunction).  - Aortic valve: Mildly thickened leaflets.  - Mitral valve: Calcified annulus. There was mild regurgitation.  - Right ventricle: The cavity size was normal. Wall thickness was    normal. Systolic function was normal.   EKG Interpretation Date/Time:  Monday October 24 2023 14:08:01 EST Ventricular Rate:  102 PR Interval:  184 QRS Duration:  118 QT Interval:  350 QTC Calculation: 456 R Axis:   -11  Text Interpretation: Sinus tachycardia Possible Left atrial enlargement Right bundle branch block When compared with ECG of 30-Jul-2023 09:30, Premature ventricular complexes are no longer Present  Confirmed by [place], Alexander Peterson [phone]) on 10/24/2023 2:09:42 PM    Recent Labs: 07/30/2023: BUN 11; Creatinine, Ser 1.05; Hemoglobin 11.9; Platelets 178; Potassium 4.5; Sodium 126  Recent Lipid Panel No results found for: "CHOL", "TRIG", "HDL", "CHOLHDL", "VLDL", "LDLCALC", "LDLDIRECT"   Risk Assessment/Calculations:                Physical Exam:    VS:  BP 132/64   Pulse (!) 102   Ht 5\' 10"  (1.778 Peterson)   Wt 178 lb (80.7 kg)   SpO2 99%   BMI 25.54 kg/Peterson     Wt Readings from Last 3 Encounters:  10/24/23 178 lb (80.7 kg)  10/05/23 178 lb 12.8 oz (81.1 kg)  07/30/23 184 lb (83.5 kg)     GEN:  Well nourished, well developed in no acute distress HEENT: Normal NECK: No JVD; No carotid bruits LYMPHATICS: No lymphadenopathy CARDIAC: RRR, no murmurs, rubs, gallops RESPIRATORY:  Clear to auscultation without rales, wheezing or rhonchi  ABDOMEN: Soft, non-tender, non-distended MUSCULOSKELETAL:  No edema; No deformity  SKIN: Warm and dry NEUROLOGIC:  Alert and oriented x 3 PSYCHIATRIC:  Normal affect   ASSESSMENT:    1. PVC's (premature ventricular contractions)   2. Primary hypertension   3. Hyperlipidemia, unspecified hyperlipidemia type    PLAN:    In order of problems listed above:  PVCs. Noted in ED in November. Not present today. No symptoms according to the patient. Prior Echo showed normal Cardiac function. Reassured patient concerning these PVCs. Avoid high amounts of caffeine. No treatment needed unless he develops new symptoms Coronary artery calcification. No symptoms. At his age focus on optimal risk factor modification DM HLD on statin. Labs ordered by PCP HTN controlled on exam today  Plan follow up prn new cardiac symptoms            Medication Adjustments/Labs and Tests Ordered: Current medicines are reviewed at length with the patient today.  Concerns regarding medicines are outlined above.  Orders Placed This Encounter  Procedures   EKG 12-Lead    No orders of the defined types were placed in this encounter.   Patient Instructions  Medication Instructions:  Your physician recommends that you continue on your current medications as directed. Please refer to the Current Medication list given to you today.  *If you need a refill on your cardiac medications before your next appointment, please call your pharmacy*   Follow-Up: At [facility], you and your health needs are our priority.  As part of our continuing mission to provide you with exceptional heart care, we have created designated Provider Care Teams.  These Care Teams include your primary Cardiologist (physician) and Advanced Practice Providers (APPs -  Physician Assistants and Nurse Practitioners) who all work together to provide you with the care you need, when you need it.  We recommend signing up for the patient portal called "MyChart".  Sign up information is provided on this After Visit Summary.  MyChart is used to connect with patients for Virtual Visits (Telemedicine).  Patients are able to view lab/test results, encounter notes, upcoming appointments, etc.  Non-urgent messages can be sent to your provider as well.   To learn more about what you can do with MyChart, go to [email or web address].    Your next appointment:   As needed  Provider:   Dr. [place]     Signed, Derryck Shahan [place], MD  10/24/2023 2:36 PM    [facility]

## 2023-10-25 ENCOUNTER — Ambulatory Visit (HOSPITAL_BASED_OUTPATIENT_CLINIC_OR_DEPARTMENT_OTHER): Payer: Self-pay | Admitting: Family Medicine

## 2023-10-25 NOTE — Telephone Encounter (Signed)
Copied from CRM [id number]. Topic: Clinical - Red Word Triage >> Oct 25, 2023  4:55 PM Clayton Bibles wrote: Red Word that prompted transfer to Nurse Triage: Extremely weak; had 2 dizzy spells this morning 8 AM and 9:30 AM; He had to sit down. It has been going on since yesterday He checked blood sugar today and it was 200 after insulin and breakfast  Yesterday he had a appointment with Dr. [place] - everything check out good with his heart   Chief Complaint: Dizziness  Symptoms: Dizziness, fatigue  Frequency: 2 episodes today, dizziness now resolved  Pertinent Negatives: Patient denies any other symptom at this time Disposition: [] ED /[] Urgent Care (no appt availability in office) / [x] Appointment(In office/virtual)/ []  [facility]/ [] Home Care/ [] Refused Recommended Disposition /[] [facility]/ []  Follow-up with PCP Additional Notes: Patient reports that while shopping this morning he began to experience dizziness. He states he did not fall but was feeling fatigued. He states he has similar dizziness when his blood sugar is low but that it was 200 when he checked it. Patient states his dizziness is resolved but is still feeling fatigued.    Reason for Disposition  [1] MILD dizziness (e.g., walking normally) AND [2] has NOT been evaluated by doctor (or NP/PA) for this  (Exception: Dizziness caused by heat exposure, sudden standing, or poor fluid intake.)  Answer Assessment - Initial Assessment Questions 1. DESCRIPTION: "Describe your dizziness."     Woozy 2. LIGHTHEADED: "Do you feel lightheaded?" (e.g., somewhat faint, woozy, weak upon standing)     Yes 3. VERTIGO: "Do you feel like either you or the room is spinning or tilting?" (i.e. vertigo)     No 4. SEVERITY: "How bad is it?"  "Do you feel like you are going to faint?" "Can you stand and walk?"   - MILD: Feels slightly dizzy, but walking normally.   - MODERATE: Feels unsteady when walking, but not falling; interferes  with normal activities (e.g., school, work).   - SEVERE: Unable to walk without falling, or requires assistance to walk without falling; feels like passing out now.      No dizziness at this time  5. ONSET:  "When did the dizziness begin?"     This morning 6. AGGRAVATING FACTORS: "Does anything make it worse?" (e.g., standing, change in head position)     No dizziness currently  7. HEART RATE: "Can you tell me your heart rate?" "How many beats in 15 seconds?"  (Note: not all patients can do this)       95 8. CAUSE: "What do you think is causing the dizziness?"     Unsure 9. RECURRENT SYMPTOM: "Have you had dizziness before?" If Yes, ask: "When was the last time?" "What happened that time?"     Yes, usually due to low blood sugar 10. OTHER SYMPTOMS: "Do you have any other symptoms?" (e.g., fever, chest pain, vomiting, diarrhea, bleeding)       Fatigue  Protocols used: Dizziness - Lightheadedness-A-AH

## 2023-10-27 ENCOUNTER — Encounter (HOSPITAL_BASED_OUTPATIENT_CLINIC_OR_DEPARTMENT_OTHER): Payer: Self-pay | Admitting: Family Medicine

## 2023-10-27 ENCOUNTER — Encounter (HOSPITAL_BASED_OUTPATIENT_CLINIC_OR_DEPARTMENT_OTHER): Payer: Self-pay | Admitting: *Deleted

## 2023-10-27 ENCOUNTER — Ambulatory Visit (HOSPITAL_BASED_OUTPATIENT_CLINIC_OR_DEPARTMENT_OTHER): Payer: Medicare Other | Admitting: Family Medicine

## 2023-10-27 VITALS — BP 132/76 | HR 81 | Temp 97.7°F | Ht 71.0 in | Wt 175.0 lb

## 2023-10-27 DIAGNOSIS — R42 Dizziness and giddiness: Secondary | ICD-10-CM | POA: Insufficient documentation

## 2023-10-27 DIAGNOSIS — Z794 Long term (current) use of insulin: Secondary | ICD-10-CM

## 2023-10-27 DIAGNOSIS — E1121 Type 2 diabetes mellitus with diabetic nephropathy: Secondary | ICD-10-CM | POA: Diagnosis not present

## 2023-10-27 NOTE — Assessment & Plan Note (Signed)
Patient dealt with dizziness/feeling woozy earlier in the week.  He has had similar symptoms in the past and typically this has been related to low blood sugar.  However, with recent episode, he did eventually check his blood sugar and it was around 240.  He is thus not certain why he had recent symptoms.  Symptoms lasted through most the day on Tuesday this week.  He did not have any associated chest pain, shortness of breath, trouble breathing.  The day prior to this, he did have appointment with cardiologist and reports that everything was normal at that time.  He did have EKG completed in cardiology office which showed sinus tachycardia, RBBB.  He had PVC noted at time of emergency department visit a few months ago, however these were not seen on recent EKG with cardiology.  With recent episode, he did not have any falls, no presyncope or syncope.  Since resolution of symptoms on Tuesday, has not had any recurrence. On exam, patient is in no acute distress, vital signs stable.  Cardiovascular exam with borderline tachycardia, regular rhythm.  Lungs clear to auscultation bilaterally.  Normal gait in office. Uncertain cause for recent symptoms.  I do feel that cardiac etiology is less likely given recent cardiac evaluation without abnormality noted at that time.  I also feel that it is less likely related to a metabolic issue such as blood sugar derangement blood sugar derangement.  Additional consideration is neurologic etiology.  Given this, would recommend further evaluation with neurologist, patient medical, referral placed.  Would also recommend intermittent monitoring of blood pressure and checking blood pressure should symptoms recur.

## 2023-10-27 NOTE — Progress Notes (Signed)
Procedures performed today:    None.  Independent interpretation of notes and tests performed by another provider:   None.  Brief History, Exam, Impression, and Recommendations:    BP 132/76 (BP Location: Right Arm, Patient Position: Sitting)   Pulse 81   Temp 97.7 F (36.5 C) (Oral)   Ht 5\' 11"  (1.803 m)   Wt 175 lb (79.4 kg)   SpO2 100%   BMI 24.41 kg/m   Dizziness Assessment & Plan: Patient dealt with dizziness/feeling woozy earlier in the week.  He has had similar symptoms in the past and typically this has been related to low blood sugar.  However, with recent episode, he did eventually check his blood sugar and it was around 240.  He is thus not certain why he had recent symptoms.  Symptoms lasted through most the day on Tuesday this week.  He did not have any associated chest pain, shortness of breath, trouble breathing.  The day prior to this, he did have appointment with cardiologist and reports that everything was normal at that time.  He did have EKG completed in cardiology office which showed sinus tachycardia, RBBB.  He had PVC noted at time of emergency department visit a few months ago, however these were not seen on recent EKG with cardiology.  With recent episode, he did not have any falls, no presyncope or syncope.  Since resolution of symptoms on Tuesday, has not had any recurrence. On exam, patient is in no acute distress, vital signs stable.  Cardiovascular exam with borderline tachycardia, regular rhythm.  Lungs clear to auscultation bilaterally.  Normal gait in office. Uncertain cause for recent symptoms.  I do feel that cardiac etiology is less likely given recent cardiac evaluation without abnormality noted at that time.  I also feel that it is less likely related to a metabolic issue such as blood sugar derangement blood sugar derangement.  Additional consideration is neurologic etiology.  Given this, would recommend further evaluation with neurologist, patient  medical, referral placed.  Would also recommend intermittent monitoring of blood pressure and checking blood pressure should symptoms recur.  Orders: -     Ambulatory referral to Neurology  Type 2 diabetes mellitus with diabetic nephropathy, with long-term current use of insulin (HCC) Assessment & Plan: Did have labs completed previously by another provider.  Lab report brought to office today.  This report indicates hemoglobin A1c of 8.1%, which is up from prior readings.  Prior reading in chart that we have access to indicates A1c of 7.4% in July.  He does have blood sugar log with him.  Typically he will check fasting blood sugar in the morning we will also check blood sugar at night before bed.  Fasting blood sugar readings have ranged from 50-120.  Readings below 70 have been infrequent, however have occurred a couple times per week.  Does not endorse any significant symptoms with these readings. Unfortunately, A1c is above goal for patient, however difficulty with making medication adjustment given that he does have notably low blood sugar readings for some of his fasting readings.  Given this, we will refer to endocrinology for further assistance with management of blood sugar.  Advised on more frequent blood sugar checks to identify when blood sugar may be running higher.  It is possible that he may require lower dose of basal insulin, but with use of mealtime insulin if notable excursions are noted and postprandial readings.  Also discussed dietary considerations as another consideration is that  if having poor dietary choices then this could also be leading to notable postprandial elevations which could be driving elevation in Z6X  Orders: -     Ambulatory referral to Endocrinology  Return if symptoms worsen or fail to improve.   ___________________________________________ Mahrukh Seguin de [place], MD, ABFM, CAQSM Primary Care and Sports Medicine [facility]

## 2023-10-27 NOTE — Assessment & Plan Note (Signed)
Did have labs completed previously by another provider.  Lab report brought to office today.  This report indicates hemoglobin A1c of 8.1%, which is up from prior readings.  Prior reading in chart that we have access to indicates A1c of 7.4% in July.  He does have blood sugar log with him.  Typically he will check fasting blood sugar in the morning we will also check blood sugar at night before bed.  Fasting blood sugar readings have ranged from 50-120.  Readings below 70 have been infrequent, however have occurred a couple times per week.  Does not endorse any significant symptoms with these readings. Unfortunately, A1c is above goal for patient, however difficulty with making medication adjustment given that he does have notably low blood sugar readings for some of his fasting readings.  Given this, we will refer to endocrinology for further assistance with management of blood sugar.  Advised on more frequent blood sugar checks to identify when blood sugar may be running higher.  It is possible that he may require lower dose of basal insulin, but with use of mealtime insulin if notable excursions are noted and postprandial readings.  Also discussed dietary considerations as another consideration is that if having poor dietary choices then this could also be leading to notable postprandial elevations which could be driving elevation in W2N

## 2023-10-27 NOTE — Patient Instructions (Addendum)

## 2023-10-28 ENCOUNTER — Encounter (HOSPITAL_BASED_OUTPATIENT_CLINIC_OR_DEPARTMENT_OTHER): Payer: Self-pay | Admitting: *Deleted

## 2023-10-31 ENCOUNTER — Telehealth: Payer: Self-pay | Admitting: Cardiology

## 2023-10-31 NOTE — Telephone Encounter (Signed)
Called and spoke to patient with wife present. Per patient saw PCP on Thursday for dizziness.  Patient reports on Saturday pressure in chest and pinprick in last rib on right side and flutter. Patient verbalizes EMS was called to home and  evaluated. Advised to follow up with provider since symptoms had resolved. Patient blood pressure 131/81, 75 and patient denies dizziness, chest pain or chest pressure or pain the radiated to shoulder. Advise patient to go to hospital for unrelieved chest pain , sob and dizziness. Patient verbalized and understanding. Appointment made with provider Dr. Swaziland 2/10 @4 :00 pm to be seen.

## 2023-10-31 NOTE — Telephone Encounter (Signed)
   Pt c/o of Chest Pain: STAT if active CP, including tightness, pressure, jaw pain, radiating pain to shoulder/upper arm/back, CP unrelieved by Nitro. Symptoms reported of SOB, nausea, vomiting, sweating.  1. Are you having CP right now?   No   2. Are you experiencing any other symptoms (ex. SOB, nausea, vomiting, sweating)?   No  3. Is your CP continuous or coming and going?  Continuous for about 2 hours  4. Have you taken Nitroglycerin?   No  5. How long have you been experiencing CP?   For two hours on Saturday  6. If NO CP at time of call then end call with telling Pt to call back or call 911 if Chest pain returns prior to return call from triage team.   Wife Okey Regal) stated patient had chest pain/pressure in his chest on Saturday (2/1) and they call EMS.  Wife stated EMS did an EKG and suggested patient get a heart monitor.  Wife stated patient has been off aspirin since 2020.  Wife wants a call back to discuss next steps.

## 2023-11-01 NOTE — Progress Notes (Signed)
 Cardiology Office Note:    Date:  11/07/2023   ID:  Alexander Peterson, Alexander Peterson 1941/08/03, MRN [id number]  PCP:  de [place], Alexander J, MD   [facility] Providers Cardiologist:  None     Referring MD: de [place], Alexander Jansky, MD   Chief Complaint  Patient presents with   Chest Pain    History of Present Illness:    Alexander Peterson is a 83 y.o. male is seen for evaluation of dizziness. Seen last month for irregular heart beat.  He has a history of DM type 2, HTN, and HLD. Echo done in 2019 was normal. Prior CT chest in 2019 did show evidence of coronary and aortic calcification. He was seen in the ED in November with dehydration. Noted at that time to have PVCs. These were completely asymptomatic. He in fact denies any chest pain, dizziness, palpitations or dyspnea. He does have some chronic fatigue.   He reports he had an episode in late Jan - felt a pinch in his right side. Became very flushed like his chest was on fire. About 30 minutes later felt a weight like bricks on his chest that lasted 30 minutes. EMS called - Ecg showed no change in RBBB. There was artifact on one tracing. Has felt fine since then.  Past Medical History:  Diagnosis Date   Diabetes mellitus without complication (HCC)    Hypercholesteremia    Hypertension    Skin cancer     Past Surgical History:  Procedure Laterality Date   upper instetine removed  2020    Current Medications: Current Meds  Medication Sig   ACCU-CHEK AVIVA PLUS test strip 2 (two) times daily.   Calcium Carb-Cholecalciferol (CALCIUM 600+D3) 600-200 MG-UNIT TABS Take 1 tablet by mouth 2 (two) times daily. With lunch and dinner   cyanocobalamin (VITAMIN B12) 500 MCG tablet Take 2,500 mcg by mouth daily. Pt. Takes a half once daily.   ferrous sulfate 325 (65 FE) MG tablet Take 325 mg by mouth 3 (three) times a week.   glucosamine-chondroitin 500-400 MG tablet Take 1 tablet by mouth once.   insulin glargine (LANTUS) 100 UNIT/ML injection Inject  0-12 Units into the skin 2 (two) times daily. Pt. Takes 17 units in the morning.   lisinopril (PRINIVIL,ZESTRIL) 40 MG tablet Take 40 mg by mouth daily.   metFORMIN (GLUCOPHAGE) 1000 MG tablet Take 1,000 mg by mouth 2 (two) times daily.   Multiple Vitamin (MULTIVITAMIN) tablet Take 1 tablet by mouth daily.   Multiple Vitamins-Minerals (ICAPS AREDS 2 PO) Take by mouth.   simvastatin (ZOCOR) 40 MG tablet Take 40 mg by mouth daily.   vitamin E 400 UNIT capsule Take 400 Units by mouth every other day.     Allergies:   Patient has no known allergies.   Social History   Socioeconomic History   Marital status: Married    Spouse name: Not on file   Number of children: 2   Years of education: Not on file   Highest education level: Doctorate  Occupational History   Not on file  Tobacco Use   Smoking status: Never    Passive exposure: Never   Smokeless tobacco: Never  Vaping Use   Vaping status: Never Used  Substance and Sexual Activity   Alcohol use: No   Drug use: No   Sexual activity: Not Currently    Partners: Female    Comment: MARRIED  Other Topics Concern   Not on file  Social History Narrative  Retired Surveyor, minerals   Social Drivers of Corporate investment banker Strain: Low Risk  (10/26/2023)   Overall Financial Resource Strain (CARDIA)    Difficulty of Paying Living Expenses: Not hard at all  Food Insecurity: No Food Insecurity (10/26/2023)   Hunger Vital Sign    Worried About Running Out of Food in the Last Year: Never true    Ran Out of Food in the Last Year: Never true  Transportation Needs: No Transportation Needs (10/26/2023)   PRAPARE - Administrator, Civil Service (Medical): No    Lack of Transportation (Non-Medical): No  Physical Activity: Sufficiently Active (10/26/2023)   Exercise Vital Sign    Days of Exercise per Week: 6 days    Minutes of Exercise per Session: 30 min  Stress: No Stress Concern Present (10/26/2023)   [employer] of  Occupational Health - Occupational Stress Questionnaire    Feeling of Stress : Not at all  Social Connections: Moderately Isolated (10/26/2023)   Social Connection and Isolation Panel [NHANES]    Frequency of Communication with Friends and Family: Twice a week    Frequency of Social Gatherings with Friends and Family: Never    Attends Religious Services: More than 4 times per year    Active Member of [employer] or Organizations: No    Attends Engineer, structural: Not on file    Marital Status: Married     Family History: The patient's family history includes Cancer in his mother; Heart attack in his mother; Heart disease in his mother; Stroke in his sister.  ROS:   Please see the history of present illness.     All other systems reviewed and are negative.  EKGs/Labs/Other Studies Reviewed:    The following studies were reviewed today:      Echo 12/06/17: Study Conclusions   - Left ventricle: The cavity size was normal. Wall thickness was    increased in a pattern of mild LVH. Systolic function was normal.    The estimated ejection fraction was in the range of 60% to 65%.    Wall motion was normal; there were no regional wall motion    abnormalities. Doppler parameters are consistent with abnormal    left ventricular relaxation (grade 1 diastolic dysfunction).  - Aortic valve: Mildly thickened leaflets.  - Mitral valve: Calcified annulus. There was mild regurgitation.  - Right ventricle: The cavity size was normal. Wall thickness was    normal. Systolic function was normal.        Recent Labs: 07/30/2023: BUN 11; Creatinine, Ser 1.05; Hemoglobin 11.9; Platelets 178; Potassium 4.5; Sodium 126  Recent Lipid Panel No results found for: "CHOL", "TRIG", "HDL", "CHOLHDL", "VLDL", "LDLCALC", "LDLDIRECT"   Risk Assessment/Calculations:                Physical Exam:    VS:  BP 126/74   Pulse (!) 108   Ht 5\' 10"  (1.778 m)   Wt 175 lb 9.6 oz (79.7 kg)   SpO2 95%    BMI 25.20 kg/m     Wt Readings from Last 3 Encounters:  11/07/23 175 lb 9.6 oz (79.7 kg)  10/27/23 175 lb (79.4 kg)  10/24/23 178 lb (80.7 kg)     GEN:  Well nourished, well developed in no acute distress HEENT: Normal NECK: No JVD; No carotid bruits LYMPHATICS: No lymphadenopathy CARDIAC: RRR, no murmurs, rubs, gallops RESPIRATORY:  Clear to auscultation without rales, wheezing or rhonchi  ABDOMEN: Soft, non-tender,  non-distended MUSCULOSKELETAL:  No edema; No deformity  SKIN: Warm and dry NEUROLOGIC:  Alert and oriented x 3 PSYCHIATRIC:  Normal affect   ASSESSMENT:    1. Chest pain, precordial   2. Primary hypertension   3. PVC's (premature ventricular contractions)   4. Hyperlipidemia, unspecified hyperlipidemia type   5. Coronary artery calcification     PLAN:    In order of problems listed above:  Chest pain. Prior history of coronary calcification. When I saw him before he was asymptomatic. Given these new symptoms recommend ischemic evaluation with cardiac PET CT. PVCs. Noted in ED in November. Not present today. No symptoms . Once again if PET CT is ok then PVCs are benign.  Reassured patient concerning these PVCs. Avoid high amounts of caffeine.  Coronary artery calcification. See above.  DM HLD on statin.  HTN controlled on exam today             Medication Adjustments/Labs and Tests Ordered: Current medicines are reviewed at length with the patient today.  Concerns regarding medicines are outlined above.  Orders Placed This Encounter  Procedures   Cardiac Stress Test: Informed Consent Details: Physician/Practitioner Attestation; Transcribe to consent form and obtain patient signature   No orders of the defined types were placed in this encounter.   There are no Patient Instructions on file for this visit.   Signed, Cythnia Osmun [place], MD  11/07/2023 4:17 PM    [facility]

## 2023-11-07 ENCOUNTER — Ambulatory Visit: Payer: Medicare Other | Attending: Cardiology | Admitting: Cardiology

## 2023-11-07 ENCOUNTER — Encounter: Payer: Self-pay | Admitting: Cardiology

## 2023-11-07 VITALS — BP 126/74 | HR 108 | Ht 70.0 in | Wt 175.6 lb

## 2023-11-07 DIAGNOSIS — I10 Essential (primary) hypertension: Secondary | ICD-10-CM | POA: Insufficient documentation

## 2023-11-07 DIAGNOSIS — I493 Ventricular premature depolarization: Secondary | ICD-10-CM | POA: Diagnosis present

## 2023-11-07 DIAGNOSIS — I2510 Atherosclerotic heart disease of native coronary artery without angina pectoris: Secondary | ICD-10-CM | POA: Diagnosis present

## 2023-11-07 DIAGNOSIS — E785 Hyperlipidemia, unspecified: Secondary | ICD-10-CM | POA: Insufficient documentation

## 2023-11-07 DIAGNOSIS — R072 Precordial pain: Secondary | ICD-10-CM | POA: Diagnosis not present

## 2023-11-07 NOTE — Patient Instructions (Addendum)
 Medication Instructions:  Continue same medications    Lab Work: None ordered   Testing/Procedures: Cardiac Pet Scan to be scheduled at [facility] after approved by insurance   Follow directions below   Follow-Up: At [facility], you and your health needs are our priority.  As part of our continuing mission to provide you with exceptional heart care, we have created designated Provider Care Teams.  These Care Teams include your primary Cardiologist (physician) and Advanced Practice Providers (APPs -  Physician Assistants and Nurse Practitioners) who all work together to provide you with the care you need, when you need it.  We recommend signing up for the patient portal called "MyChart".  Sign up information is provided on this After Visit Summary.  MyChart is used to connect with patients for Virtual Visits (Telemedicine).  Patients are able to view lab/test results, encounter notes, upcoming appointments, etc.  Non-urgent messages can be sent to your provider as well.   To learn more about what you can do with MyChart, go to [email or web address].    Your next appointment:  To Be Determined after test    Provider:  Dr.Jordan             Please report to Radiology at the [facility] Main Entrance 30 minutes early for your test.  [address] [place], [place] [address]                         OR   Please report to Radiology at [facility] Main Entrance, medical mall, 30 mins prior to your test.  [address]  [place], [place]  How to Prepare for Your Cardiac PET/CT Stress Test:  Nothing to eat or drink, except water, 3 hours prior to arrival time.  NO caffeine/decaffeinated products, or chocolate 12 hours prior to arrival. (Please note decaffeinated beverages (teas/coffees) still contain caffeine).  If you have caffeine within 12 hours prior, the test will need to be rescheduled.  Medication instructions: Do  not take erectile dysfunction medications for 72 hours prior to test (sildenafil, tadalafil) Do not take nitrates (isosorbide mononitrate, Ranexa) the day before or day of test Do not take tamsulosin the day before or morning of test Hold theophylline containing medications for 12 hours. Hold Dipyridamole 48 hours prior to the test.  Diabetic Preparation: If able to eat breakfast prior to 3 hour fasting, you may take all medications, including your insulin. Do not worry if you miss your breakfast dose of insulin - start at your next meal. If you do not eat prior to 3 hour fast-Hold all diabetes (oral and insulin) medications. Patients who wear a continuous glucose monitor MUST remove the device prior to scanning.  You may take your remaining medications with water.  NO perfume, cologne or lotion on chest or abdomen area.   Total time is 1 to 2 hours; you may want to bring reading material for the waiting time.    In preparation for your appointment, medication and supplies will be purchased.  Appointment availability is limited, so if you need to cancel or reschedule, please call the Radiology Department Scheduler at [phone] 24 hours in advance to avoid a cancellation fee of $100.00  What to Expect When you Arrive:  Once you arrive and check in for your appointment, you will be taken to a preparation room within the Radiology Department.  A technologist or Nurse will obtain your medical  history, verify that you are correctly prepped for the exam, and explain the procedure.  Afterwards, an IV will be started in your arm and electrodes will be placed on your skin for EKG monitoring during the stress portion of the exam. Then you will be escorted to the PET/CT scanner.  There, staff will get you positioned on the scanner and obtain a blood pressure and EKG.  During the exam, you will continue to be connected to the EKG and blood pressure machines.  A small, safe amount of a radioactive  tracer will be injected in your IV to obtain a series of pictures of your heart along with an injection of a stress agent.    After your Exam:  It is recommended that you eat a meal and drink a caffeinated beverage to counter act any effects of the stress agent.  Drink plenty of fluids for the remainder of the day and urinate frequently for the first couple of hours after the exam.  Your doctor will inform you of your test results within 7-10 business days.  For more information and frequently asked questions, please visit our website: [email or web address]  For questions about your test or how to prepare for your test, please call: Cardiac Imaging Nurse Navigators Office: [phone]

## 2023-11-22 ENCOUNTER — Ambulatory Visit (INDEPENDENT_AMBULATORY_CARE_PROVIDER_SITE_OTHER): Payer: Medicare Other | Admitting: *Deleted

## 2023-11-22 ENCOUNTER — Encounter (HOSPITAL_BASED_OUTPATIENT_CLINIC_OR_DEPARTMENT_OTHER): Payer: Self-pay

## 2023-11-22 DIAGNOSIS — Z0000 Encounter for general adult medical examination without abnormal findings: Secondary | ICD-10-CM | POA: Diagnosis not present

## 2023-11-22 NOTE — Progress Notes (Signed)
 Subjective:   Alexander Peterson is a 83 y.o. male who presents for Medicare Annual/Subsequent preventive examination.  Visit Complete: Virtual I connected with  Alexander Peterson on 11/22/23 by a audio enabled telemedicine application and verified that I am speaking with the correct person using two identifiers.  Patient Location: Home  Provider Location: Home Office  I discussed the limitations of evaluation and management by telemedicine. The patient expressed understanding and agreed to proceed.  Vital Signs: Because this visit was a virtual/telehealth visit, some criteria may be missing or patient reported. Any vitals not documented were not able to be obtained and vitals that have been documented are patient reported.  Patient Medicare AWV questionnaire was completed by the patient on 11-21-2023; I have confirmed that all information answered by patient is correct and no changes since this date.  Cardiac Risk Factors include: advanced age (>36men, >43 women);male gender;diabetes mellitus     Objective:    There were no vitals filed for this visit. There is no height or weight on file to calculate BMI.     11/22/2023    2:41 PM 07/30/2023    9:30 AM 03/02/2018    3:22 PM 11/19/2017    6:30 PM  Advanced Directives  Does Patient Have a Medical Advance Directive? No No No No  Would patient like information on creating a medical advance directive? No - Patient declined       Current Medications (verified) Outpatient Encounter Medications as of 11/22/2023  Medication Sig   ACCU-CHEK AVIVA PLUS test strip 2 (two) times daily.   Calcium Carb-Cholecalciferol (CALCIUM 600+D3) 600-200 MG-UNIT TABS Take 1 tablet by mouth 2 (two) times daily. With lunch and dinner   cyanocobalamin (VITAMIN B12) 500 MCG tablet Take 2,500 mcg by mouth daily. Pt. Takes a half once daily.   ferrous sulfate 325 (65 FE) MG tablet Take 325 mg by mouth 3 (three) times a week.   glucosamine-chondroitin 500-400 MG tablet  Take 1 tablet by mouth once.   insulin glargine (LANTUS) 100 UNIT/ML injection Inject 0-12 Units into the skin 2 (two) times daily. Pt. Takes 17 units in the morning.   lisinopril (PRINIVIL,ZESTRIL) 40 MG tablet Take 40 mg by mouth daily.   metFORMIN (GLUCOPHAGE) 1000 MG tablet Take 1,000 mg by mouth 2 (two) times daily.   Multiple Vitamin (MULTIVITAMIN) tablet Take 1 tablet by mouth daily.   Multiple Vitamins-Minerals (ICAPS AREDS 2 PO) Take by mouth.   pseudoephedrine (SUDAFED) 120 MG 12 hr tablet Take 120 mg by mouth 2 (two) times daily.   simvastatin (ZOCOR) 40 MG tablet Take 40 mg by mouth daily.   vitamin E 400 UNIT capsule Take 400 Units by mouth every other day.   No facility-administered encounter medications on file as of 11/22/2023.    Allergies (verified) Patient has no known allergies.   History: Past Medical History:  Diagnosis Date   Diabetes mellitus without complication (HCC)    Hypercholesteremia    Hypertension    Skin cancer    Past Surgical History:  Procedure Laterality Date   upper instetine removed  2020   Family History  Problem Relation Age of Onset   Heart disease Mother        CABG   Heart attack Mother    Cancer Mother    Stroke Sister    Social History   Socioeconomic History   Marital status: Married    Spouse name: Not on file   Number of children: 2  Years of education: Not on file   Highest education level: Doctorate  Occupational History   Not on file  Tobacco Use   Smoking status: Never    Passive exposure: Never   Smokeless tobacco: Never  Vaping Use   Vaping status: Never Used  Substance and Sexual Activity   Alcohol use: No   Drug use: No   Sexual activity: Not Currently    Partners: Female    Comment: MARRIED  Other Topics Concern   Not on file  Social History Narrative   Retired Surveyor, minerals   Social Drivers of Health   Financial Resource Strain: Low Risk  (11/22/2023)   Overall Financial Resource Strain  (CARDIA)    Difficulty of Paying Living Expenses: Not hard at all  Food Insecurity: No Food Insecurity (11/22/2023)   Hunger Vital Sign    Worried About Running Out of Food in the Last Year: Never true    Ran Out of Food in the Last Year: Never true  Transportation Needs: No Transportation Needs (11/22/2023)   PRAPARE - Administrator, Civil Service (Medical): No    Lack of Transportation (Non-Medical): No  Physical Activity: Sufficiently Active (11/22/2023)   Exercise Vital Sign    Days of Exercise per Week: 6 days    Minutes of Exercise per Session: 30 min  Stress: No Stress Concern Present (11/22/2023)   [employer] of Occupational Health - Occupational Stress Questionnaire    Feeling of Stress : Not at all  Social Connections: Moderately Isolated (11/22/2023)   Social Connection and Isolation Panel [NHANES]    Frequency of Communication with Friends and Family: Twice a week    Frequency of Social Gatherings with Friends and Family: Never    Attends Religious Services: More than 4 times per year    Active Member of [employer] or Organizations: No    Attends Banker Meetings: Never    Marital Status: Married    Tobacco Counseling Counseling given: Not Answered   Clinical Intake:                        Activities of Daily Living    11/22/2023    2:38 PM 11/21/2023    7:32 PM  In your present state of health, do you have any difficulty performing the following activities:  Hearing? 0 0  Vision? 0 0  Difficulty concentrating or making decisions? 0 0  Walking or climbing stairs? 0 0  Dressing or bathing? 0 0  Doing errands, shopping? 0 0  Preparing Food and eating ? N N  Using the Toilet? N N  In the past six months, have you accidently leaked urine? N N  Do you have problems with loss of bowel control? N N  Managing your Medications? N N  Managing your Finances? N N  Housekeeping or managing your Housekeeping? N N    Patient Care  Team: [place], Buren Kos, MD as PCP - General (Family Medicine)  Indicate any recent Medical Services you may have received from other than [facility] providers in the past year (date may be approximate).     Assessment:   This is a routine wellness examination for Alexander Peterson.  Hearing/Vision screen Hearing Screening - Comments:: No trouble hearing Vision Screening - Comments:: Up to date Sharyne Peach  Opthalmology Retina specialist   Goals Addressed             This Visit's Progress  Patient Stated       Stay healthy       Depression Screen    11/22/2023    2:43 PM 10/27/2023   11:12 AM 10/05/2023    1:09 PM  PHQ 2/9 Scores  PHQ - 2 Score 0 0 0  PHQ- 9 Score 0 0 0    Fall Risk    11/22/2023    2:56 PM 11/21/2023    7:32 PM 10/27/2023   11:11 AM 10/05/2023    1:08 PM  Fall Risk   Falls in the past year? 0 0 0 0  Number falls in past yr: 0  0 0  Injury with Fall? 0  0 0  Risk for fall due to :   No Fall Risks No Fall Risks  Follow up Falls evaluation completed;Education provided;Falls prevention discussed  Falls evaluation completed Falls evaluation completed    MEDICARE RISK AT HOME: Medicare Risk at Home Any stairs in or around the home?: Yes If so, are there any without handrails?: No Home free of loose throw rugs in walkways, pet beds, electrical cords, etc?: No Adequate lighting in your home to reduce risk of falls?: Yes Life alert?: No Use of a cane, walker or w/c?: No Grab bars in the bathroom?: No Shower chair or bench in shower?: Yes Elevated toilet seat or a handicapped toilet?: No  TIMED UP AND GO:  Was the test performed?  No    Cognitive Function:        11/22/2023    2:39 PM  6CIT Screen  What Year? 4 points  What month? 0 points  What time? 0 points  Count back from 20 0 points  Months in reverse 2 points  Repeat phrase 10 points  Total Score 16 points    Immunizations Immunization History  Administered Date(s) Administered   Fluad  Trivalent(High Dose 65+) 08/23/2023   Tdap 08/25/2017   Unspecified SARS-COV-2 Vaccination 09/29/2023   Zoster Recombinant(Shingrix) 09/12/2023    TDAP status: Up to date  Flu Vaccine status: Up to date  Pneumococcal vaccine status: Due, Education has been provided regarding the importance of this vaccine. Advised may receive this vaccine at local pharmacy or Health Dept. Aware to provide a copy of the vaccination record if obtained from local pharmacy or Health Dept. Verbalized acceptance and understanding.  Covid-19 vaccine status: Completed vaccines  Qualifies for Shingles Vaccine? No   Zostavax completed Yes   Shingrix Completed?: Yes  Screening Tests Health Maintenance  Topic Date Due   Pneumonia Vaccine 47+ Years old (1 of 2 - PCV) Never done   FOOT EXAM  Never done   HEMOGLOBIN A1C  10/09/2023   Zoster Vaccines- Shingrix (2 of 2) 11/07/2023   OPHTHALMOLOGY EXAM  03/09/2024   Diabetic kidney evaluation - Urine ACR  04/07/2024   Diabetic kidney evaluation - eGFR measurement  07/29/2024   Medicare Annual Wellness (AWV)  11/21/2024   DTaP/Tdap/Td (2 - Td or Tdap) 08/26/2027   INFLUENZA VACCINE  Completed   COVID-19 Vaccine  Completed   HPV VACCINES  Aged Out    Health Maintenance  Health Maintenance Due  Topic Date Due   Pneumonia Vaccine 39+ Years old (1 of 2 - PCV) Never done   FOOT EXAM  Never done   HEMOGLOBIN A1C  10/09/2023   Zoster Vaccines- Shingrix (2 of 2) 11/07/2023    Colorectal cancer screening: No longer required.   Lung Cancer Screening: (Low Dose CT Chest recommended if Age  50-80 years, 20 pack-year currently smoking OR have quit w/in 15years.) does not qualify.   Lung Cancer Screening Referral:   Additional Screening:  Hepatitis C Screening:  never done  Vision Screening: Recommended annual ophthalmology exams for early detection of glaucoma and other disorders of the eye. Is the patient up to date with their annual eye exam?  Yes  Who is  the provider or what is the name of the office in which the patient attends annual eye exams? [facility]Op[facility]  If pt is not established with a provider, would they like to be referred to a provider to establish care? No .   Dental Screening: Recommended annual dental exams for proper oral hygiene  Nutrition Risk Assessment:  Has the patient had any N/V/D within the last 2 months?  No  Does the patient have any non-healing wounds?  No  Has the patient had any unintentional weight loss or weight gain?  No   Diabetes:  Is the patient diabetic?  Yes  If diabetic, was a CBG obtained today?  No  Did the patient Peterson in their glucometer from home?  No  How often do you monitor your CBG's? 2 x a day.   Financial Strains and Diabetes Management:  Are you having any financial strains with the device, your supplies or your medication? No .  Does the patient want to be seen by Chronic Care Management for management of their diabetes?  No  Would the patient like to be referred to a Nutritionist or for Diabetic Management?  No   Diabetic Exams:  Diabetic Eye Exam: . Pt has been advised about the importance in completing this exam.  Diabetic Foot Exam: . Pt has been advised about the importance in completing this exam. .    Community Resource Referral / Chronic Care Management: CRR required this visit?  No   CCM required this visit?  No     Plan:     I have personally reviewed and noted the following in the patient's chart:   Medical and social history Use of alcohol, tobacco or illicit drugs  Current medications and supplements including opioid prescriptions. Patient is not currently taking opioid prescriptions. Functional ability and status Nutritional status Physical activity Advanced directives List of other physicians Hospitalizations, surgeries, and ER visits in previous 12 months Vitals Screenings to include cognitive, depression, and falls Referrals and  appointments  In addition, I have reviewed and discussed with patient certain preventive protocols, quality metrics, and best practice recommendations. A written personalized care plan for preventive services as well as general preventive health recommendations were provided to patient.     Remi Haggard, LPN   11/10/863   After Visit Summary: (MyChart) Due to this being a telephonic visit, the after visit summary with patients personalized plan was offered to patient via MyChart   Nurse Notes:

## 2023-11-22 NOTE — Patient Instructions (Signed)
 Mr. Alexander Peterson , Thank you for taking time to come for your Medicare Wellness Visit. I appreciate your ongoing commitment to your health goals. Please review the following plan we discussed and let me know if I can assist you in the future.   Screening recommendations/referrals: Colonoscopy: no longer required Recommended yearly ophthalmology/optometry visit for glaucoma screening and checkup Recommended yearly dental visit for hygiene and checkup  Vaccinations: Influenza vaccine:  Pneumococcal vaccine:  Tdap vaccine:  Shingles vaccine:       Preventive Care 65 Years and Older, Male Preventive care refers to lifestyle choices and visits with your health care provider that can promote health and wellness. What does preventive care include? A yearly physical exam. This is also called an annual well check. Dental exams once or twice a year. Routine eye exams. Ask your health care provider how often you should have your eyes checked. Personal lifestyle choices, including: Daily care of your teeth and gums. Regular physical activity. Eating a healthy diet. Avoiding tobacco and drug use. Limiting alcohol use. Practicing safe sex. Taking low doses of aspirin every day. Taking vitamin and mineral supplements as recommended by your health care provider. What happens during an annual well check? The services and screenings done by your health care provider during your annual well check will depend on your age, overall health, lifestyle risk factors, and family history of disease. Counseling  Your health care provider may ask you questions about your: Alcohol use. Tobacco use. Drug use. Emotional well-being. Home and relationship well-being. Sexual activity. Eating habits. History of falls. Memory and ability to understand (cognition). Work and work Astronomer. Screening  You may have the following tests or measurements: Height, weight, and BMI. Blood pressure. Lipid and cholesterol  levels. These may be checked every 5 years, or more frequently if you are over 46 years old. Skin check. Lung cancer screening. You may have this screening every year starting at age 61 if you have a 30-pack-year history of smoking and currently smoke or have quit within the past 15 years. Fecal occult blood test (FOBT) of the stool. You may have this test every year starting at age 13. Flexible sigmoidoscopy or colonoscopy. You may have a sigmoidoscopy every 5 years or a colonoscopy every 10 years starting at age 87. Prostate cancer screening. Recommendations will vary depending on your family history and other risks. Hepatitis C blood test. Hepatitis B blood test. Sexually transmitted disease (STD) testing. Diabetes screening. This is done by checking your blood sugar (glucose) after you have not eaten for a while (fasting). You may have this done every 1-3 years. Abdominal aortic aneurysm (AAA) screening. You may need this if you are a current or former smoker. Osteoporosis. You may be screened starting at age 58 if you are at high risk. Talk with your health care provider about your test results, treatment options, and if necessary, the need for more tests. Vaccines  Your health care provider may recommend certain vaccines, such as: Influenza vaccine. This is recommended every year. Tetanus, diphtheria, and acellular pertussis (Tdap, Td) vaccine. You may need a Td booster every 10 years. Zoster vaccine. You may need this after age 73. Pneumococcal 13-valent conjugate (PCV13) vaccine. One dose is recommended after age 1. Pneumococcal polysaccharide (PPSV23) vaccine. One dose is recommended after age 61. Talk to your health care provider about which screenings and vaccines you need and how often you need them. This information is not intended to replace advice given to you by your  health care provider. Make sure you discuss any questions you have with your health care provider. Document  Released: 10/10/2015 Document Revised: 06/02/2016 Document Reviewed: 07/15/2015 Elsevier Interactive Patient Education  2017 [employer].  Fall Prevention in the Home Falls can cause injuries. They can happen to people of all ages. There are many things you can do to make your home safe and to help prevent falls. What can I do on the outside of my home? Regularly fix the edges of walkways and driveways and fix any cracks. Remove anything that might make you trip as you walk through a door, such as a raised step or threshold. Trim any bushes or trees on the path to your home. Use bright outdoor lighting. Clear any walking paths of anything that might make someone trip, such as rocks or tools. Regularly check to see if handrails are loose or broken. Make sure that both sides of any steps have handrails. Any raised decks and porches should have guardrails on the edges. Have any leaves, snow, or ice cleared regularly. Use sand or salt on walking paths during winter. Clean up any spills in your garage right away. This includes oil or grease spills. What can I do in the bathroom? Use night lights. Install grab bars by the toilet and in the tub and shower. Do not use towel bars as grab bars. Use non-skid mats or decals in the tub or shower. If you need to sit down in the shower, use a plastic, non-slip stool. Keep the floor dry. Clean up any water that spills on the floor as soon as it happens. Remove soap buildup in the tub or shower regularly. Attach bath mats securely with double-sided non-slip rug tape. Do not have throw rugs and other things on the floor that can make you trip. What can I do in the bedroom? Use night lights. Make sure that you have a light by your bed that is easy to reach. Do not use any sheets or blankets that are too big for your bed. They should not hang down onto the floor. Have a firm chair that has side arms. You can use this for support while you get dressed. Do  not have throw rugs and other things on the floor that can make you trip. What can I do in the kitchen? Clean up any spills right away. Avoid walking on wet floors. Keep items that you use a lot in easy-to-reach places. If you need to reach something above you, use a strong step stool that has a grab bar. Keep electrical cords out of the way. Do not use floor polish or wax that makes floors slippery. If you must use wax, use non-skid floor wax. Do not have throw rugs and other things on the floor that can make you trip. What can I do with my stairs? Do not leave any items on the stairs. Make sure that there are handrails on both sides of the stairs and use them. Fix handrails that are broken or loose. Make sure that handrails are as long as the stairways. Check any carpeting to make sure that it is firmly attached to the stairs. Fix any carpet that is loose or worn. Avoid having throw rugs at the top or bottom of the stairs. If you do have throw rugs, attach them to the floor with carpet tape. Make sure that you have a light switch at the top of the stairs and the bottom of the stairs. If you do  not have them, ask someone to add them for you. What else can I do to help prevent falls? Wear shoes that: Do not have high heels. Have rubber bottoms. Are comfortable and fit you well. Are closed at the toe. Do not wear sandals. If you use a stepladder: Make sure that it is fully opened. Do not climb a closed stepladder. Make sure that both sides of the stepladder are locked into place. Ask someone to hold it for you, if possible. Clearly mark and make sure that you can see: Any grab bars or handrails. First and last steps. Where the edge of each step is. Use tools that help you move around (mobility aids) if they are needed. These include: Canes. Walkers. Scooters. Crutches. Turn on the lights when you go into a dark area. Replace any light bulbs as soon as they burn out. Set up your  furniture so you have a clear path. Avoid moving your furniture around. If any of your floors are uneven, fix them. If there are any pets around you, be aware of where they are. Review your medicines with your doctor. Some medicines can make you feel dizzy. This can increase your chance of falling. Ask your doctor what other things that you can do to help prevent falls. This information is not intended to replace advice given to you by your health care provider. Make sure you discuss any questions you have with your health care provider. Document Released: 07/10/2009 Document Revised: 02/19/2016 Document Reviewed: 10/18/2014 Elsevier Interactive Patient Education  2017 [employer].

## 2023-12-09 ENCOUNTER — Encounter (HOSPITAL_COMMUNITY): Payer: Self-pay

## 2023-12-14 ENCOUNTER — Ambulatory Visit (HOSPITAL_COMMUNITY)
Admission: RE | Admit: 2023-12-14 | Discharge: 2023-12-14 | Disposition: A | Discharge status: Home / Self Care | Payer: Medicare Other | Source: Ambulatory Visit | Attending: Cardiology | Admitting: Cardiology

## 2023-12-14 DIAGNOSIS — I2510 Atherosclerotic heart disease of native coronary artery without angina pectoris: Secondary | ICD-10-CM | POA: Diagnosis present

## 2023-12-14 DIAGNOSIS — R072 Precordial pain: Secondary | ICD-10-CM | POA: Diagnosis not present

## 2023-12-14 DIAGNOSIS — I10 Essential (primary) hypertension: Secondary | ICD-10-CM | POA: Insufficient documentation

## 2023-12-14 DIAGNOSIS — I493 Ventricular premature depolarization: Secondary | ICD-10-CM | POA: Diagnosis not present

## 2023-12-14 DIAGNOSIS — E785 Hyperlipidemia, unspecified: Secondary | ICD-10-CM | POA: Diagnosis not present

## 2023-12-14 LAB — NM PET CT CARDIAC PERFUSION MULTI W/ABSOLUTE BLOODFLOW
MBFR: 2.2
Nuc Rest EF: 57 %
Nuc Stress EF: 62 %
Rest MBF: 0.98 ml/g/min
Rest Nuclear Isotope Dose: 20.5 mCi
ST Depression (mm): 0 mm
Stress MBF: 2.16 ml/g/min
Stress Nuclear Isotope Dose: 20.5 mCi

## 2023-12-14 MED ORDER — REGADENOSON 0.4 MG/5ML IV SOLN
INTRAVENOUS | Status: AC
  Filled 2023-12-14: qty 5

## 2023-12-14 MED ORDER — RUBIDIUM RB82 GENERATOR (RUBYFILL)
20.5000 | PACK | Freq: Once | INTRAVENOUS | Status: AC
  Administered 2023-12-14: 20.5 via INTRAVENOUS

## 2023-12-14 MED ORDER — REGADENOSON 0.4 MG/5ML IV SOLN
0.4000 mg | Freq: Once | INTRAVENOUS | Status: AC
  Administered 2023-12-14: 0.4 mg via INTRAVENOUS

## 2023-12-22 ENCOUNTER — Ambulatory Visit: Payer: Medicare Other | Admitting: Cardiology

## 2024-01-09 ENCOUNTER — Encounter: Payer: Self-pay | Admitting: Neurology

## 2024-01-09 ENCOUNTER — Ambulatory Visit (INDEPENDENT_AMBULATORY_CARE_PROVIDER_SITE_OTHER): Payer: Medicare Other | Admitting: Neurology

## 2024-01-09 VITALS — BP 145/87 | HR 95 | Ht 70.5 in | Wt 179.0 lb

## 2024-01-09 DIAGNOSIS — R42 Dizziness and giddiness: Secondary | ICD-10-CM | POA: Diagnosis not present

## 2024-01-09 DIAGNOSIS — H8110 Benign paroxysmal vertigo, unspecified ear: Secondary | ICD-10-CM

## 2024-01-09 NOTE — Patient Instructions (Signed)
 Continue with daily Gatorade  Consider adding liquid IV to your water and discontinue soft drink  Continue to follow up with PCP and Cardiologist.  Return as needed

## 2024-01-09 NOTE — Progress Notes (Signed)
 [facility]  PATIENT: Alexander Peterson DOB: [birth year]  REQUESTING CLINICIAN: de Peru, Raymond J, MD HISTORY FROM: Patient and spouse  REASON FOR VISIT: Dizziness    HISTORICAL  CHIEF COMPLAINT:  Chief Complaint  Patient presents with   New Patient (Initial Visit)    Rm13, wife present, internal referral for dizziness:orthostatic bp completed. Pt stated that when he exerts himself gets dehydrated and feels faint    HISTORY OF PRESENT ILLNESS:  This is a 83 year old gentleman past medical history of hypertension, hyperlipidemia, diabetes, history of hyponatremia who is presenting with complaints of dizziness.  Patient reports history of dizziness that he felt it was related to dehydration, described as a woozy spell.  In November he had the same feeling, presented to the hospital, workup including head CT did not show any acute abnormality.  He was diagnosed with cerumen impaction.   He presented to PCP who referred patient to cardiology and neurology.  He tells me since then he has 2 additional episodes where he will feel dizzy, unsteady just for short period of time.  He also have additional episodes that he describes as a room spinning sensation associated with head turn, very short in duration, no nausea, no vomiting associated with it, denies any falls.  He has a history of chronic hyponatremia, he is on fluid restriction 30 ounce, mainly Gatorade coffee soft drinks.    OTHER MEDICAL CONDITIONS: Hypertension, Hyperlipidemia, Diabetes   REVIEW OF SYSTEMS: Full 14 system review of systems performed and negative with exception of: As noted in the HPI   ALLERGIES: No Known Allergies  HOME MEDICATIONS: Outpatient Medications Prior to Visit  Medication Sig Dispense Refill   ACCU-CHEK AVIVA PLUS test strip 2 (two) times daily.     Calcium Carb-Cholecalciferol (CALCIUM 600+D3) 600-200 MG-UNIT TABS Take 1 tablet by mouth 2 (two) times daily. With lunch and dinner      cyanocobalamin (VITAMIN B12) 500 MCG tablet Take 2,500 mcg by mouth daily. Pt. Takes a half once daily.     ferrous sulfate 325 (65 FE) MG tablet Take 325 mg by mouth 3 (three) times a week.     glucosamine-chondroitin 500-400 MG tablet Take 1 tablet by mouth once.     insulin glargine (LANTUS) 100 UNIT/ML injection Inject 0-12 Units into the skin 2 (two) times daily. Pt. Takes 17 units in the morning.     lisinopril (PRINIVIL,ZESTRIL) 40 MG tablet Take 40 mg by mouth daily.  0   metFORMIN (GLUCOPHAGE) 1000 MG tablet Take 1,000 mg by mouth 2 (two) times daily.  0   Multiple Vitamin (MULTIVITAMIN) tablet Take 1 tablet by mouth daily.     Multiple Vitamins-Minerals (ICAPS AREDS 2 PO) Take by mouth.     pseudoephedrine (SUDAFED) 120 MG 12 hr tablet Take 120 mg by mouth 2 (two) times daily.     simvastatin (ZOCOR) 40 MG tablet Take 40 mg by mouth daily.  0   vitamin E 400 UNIT capsule Take 400 Units by mouth every other day.     No facility-administered medications prior to visit.    PAST MEDICAL HISTORY: Past Medical History:  Diagnosis Date   Diabetes mellitus without complication (HCC)    Hypercholesteremia    Hypertension    Skin cancer     PAST SURGICAL HISTORY: Past Surgical History:  Procedure Laterality Date   upper instetine removed  2020    FAMILY HISTORY: Family History  Problem Relation Age of Onset   Heart disease  Mother        CABG   Heart attack Mother    Cancer Mother    Stroke Sister     SOCIAL HISTORY: Social History   Socioeconomic History   Marital status: Married    Spouse name: Not on file   Number of children: 2   Years of education: Not on file   Highest education level: Doctorate  Occupational History   Not on file  Tobacco Use   Smoking status: Never    Passive exposure: Never   Smokeless tobacco: Never  Vaping Use   Vaping status: Never Used  Substance and Sexual Activity   Alcohol use: No   Drug use: No   Sexual activity: Not  Currently    Partners: Female    Comment: MARRIED  Other Topics Concern   Not on file  Social History Narrative   Retired Surveyor, minerals   Social Drivers of Health   Financial Resource Strain: Low Risk  (11/22/2023)   Overall Financial Resource Strain (CARDIA)    Difficulty of Paying Living Expenses: Not hard at all  Food Insecurity: No Food Insecurity (11/22/2023)   Hunger Vital Sign    Worried About Running Out of Food in the Last Year: Never true    Ran Out of Food in the Last Year: Never true  Transportation Needs: No Transportation Needs (11/22/2023)   PRAPARE - Administrator, Civil Service (Medical): No    Lack of Transportation (Non-Medical): No  Physical Activity: Sufficiently Active (11/22/2023)   Exercise Vital Sign    Days of Exercise per Week: 6 days    Minutes of Exercise per Session: 30 min  Stress: No Stress Concern Present (11/22/2023)   [employer] of Occupational Health - Occupational Stress Questionnaire    Feeling of Stress : Not at all  Social Connections: Moderately Isolated (11/22/2023)   Social Connection and Isolation Panel [NHANES]    Frequency of Communication with Friends and Family: Twice a week    Frequency of Social Gatherings with Friends and Family: Never    Attends Religious Services: More than 4 times per year    Active Member of [employer] or Organizations: No    Attends Banker Meetings: Never    Marital Status: Married  Catering manager Violence: Not At Risk (11/22/2023)   Humiliation, Afraid, Rape, and Kick questionnaire    Fear of Current or Ex-Partner: No    Emotionally Abused: No    Physically Abused: No    Sexually Abused: No     PHYSICAL EXAM  GENERAL EXAM/CONSTITUTIONAL: Vitals:  Vitals:   01/09/24 1451 01/09/24 1452 01/09/24 1453 01/09/24 1454  BP: (!) 166/81 (!) 166/81 (!) 157/85 (!) 145/87  Pulse: 81 81 84 95  SpO2:  98% 96% 98%  Weight: 179 lb (81.2 kg)     Height: 5' 10.5" (1.791 m)       Body mass index is 25.32 kg/m. Wt Readings from Last 3 Encounters:  01/09/24 179 lb (81.2 kg)  11/07/23 175 lb 9.6 oz (79.7 kg)  10/27/23 175 lb (79.4 kg)   Patient is in no distress; well developed, nourished and groomed; neck is supple  MUSCULOSKELETAL: Gait, strength, tone, movements noted in Neurologic exam below  NEUROLOGIC: MENTAL STATUS:      No data to display         awake, alert, oriented to person, place and time recent and remote memory intact normal attention and concentration language fluent, comprehension intact,  naming intact fund of knowledge appropriate  CRANIAL NERVE:  2nd, 3rd, 4th, 6th - Visual fields full to confrontation, extraocular muscles intact, no nystagmus 5th - facial sensation symmetric 7th - facial strength symmetric 8th - hearing intact 9th - palate elevates symmetrically, uvula midline 11th - shoulder shrug symmetric 12th - tongue protrusion midline  MOTOR:  normal bulk and tone, full strength in the BUE, BLE  SENSORY:  normal and symmetric to light touch  COORDINATION:  finger-nose-finger, fine finger movements normal  GAIT/STATION:  normal   DIAGNOSTIC DATA (LABS, IMAGING, TESTING) - I reviewed patient records, labs, notes, testing and imaging myself where available.  Lab Results  Component Value Date   WBC 8.2 07/30/2023   HGB 11.9 (L) 07/30/2023   HCT 36.8 (L) 07/30/2023   MCV 90.6 07/30/2023   PLT 178 07/30/2023      Component Value Date/Time   NA 126 (L) 07/30/2023 0934   K 4.5 07/30/2023 0934   CL 92 (L) 07/30/2023 0934   CO2 25 07/30/2023 0934   GLUCOSE 221 (H) 07/30/2023 0934   BUN 11 07/30/2023 0934   CREATININE 1.05 07/30/2023 0934   CALCIUM 8.4 (L) 07/30/2023 0934   PROT 8.1 02/20/2018 0446   ALBUMIN 4.0 02/20/2018 0446   AST 26 02/20/2018 0446   ALT 15 (L) 02/20/2018 0446   ALKPHOS 49 02/20/2018 0446   BILITOT 0.5 02/20/2018 0446   GFRNONAA >60 07/30/2023 0934   GFRAA >60 03/02/2018 1601    No results found for: "CHOL", "HDL", "LDLCALC", "LDLDIRECT", "TRIG", "CHOLHDL" Lab Results  Component Value Date   HGBA1C 7.4 04/08/2023   No results found for: "VITAMINB12" Lab Results  Component Value Date   TSH 0.636 03/02/2018   CT Head 07/30/2023 Aging brain without acute or interval finding.      ASSESSMENT AND PLAN  83 y.o. year old male with history of hypertension, hyperlipidemia, diabetes, chronic hyponatremia who is presenting with episodes of dizzy spell that he described as woozy feeling that he feels associated with dehydration.  He also have additional episodes of room spinning sensation associated with head movement; these are very short, less than 10 seconds without nausea or vomiting and no falls.  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  We also discussed adding liquid IV to his regimen, discontinuing soda drink and hopefully slightly increasing his fluid intake, but I would have him follow-up with nephrology regarding the fluid restriction.  Advising patient to continue following up with PCP, Cardiology and return as needed.   1. Benign paroxysmal positional vertigo, unspecified laterality   2. Dizziness      Patient Instructions  Continue with daily Gatorade  Consider adding liquid IV to your water and discontinue soft drink  Continue to follow up with PCP and Cardiologist.  Return as needed   No orders of the defined types were placed in this encounter.   No orders of the defined types were placed in this encounter.   Return if symptoms worsen or fail to improve.  I have spent a total of 50 minutes dedicated to this patient today, preparing to see patient, performing a medically appropriate examination and evaluation, ordering tests and/or medications and procedures, and counseling and educating the patient/family/caregiver; independently interpreting result and  communicating results to the family/patient/caregiver; and documenting clinical information in the electronic medical record.   Cassandra Cleveland, MD 01/09/2024, 5:07 PM  [facility] [phone]  [address], [address] [place], [place] [address] [phone]

## 2024-01-10 ENCOUNTER — Ambulatory Visit: Payer: Self-pay

## 2024-01-10 NOTE — Telephone Encounter (Signed)
**Note De-identified  [place] Obfuscation** Please advise 

## 2024-01-10 NOTE — Telephone Encounter (Signed)
 Spoke to patient's wife on behalf of her husband. Patient had a PET scan completed on 12/14/23. PET scan was resulted and patient was told there were no areas for concern. Patient was seen by neurology yesterday and told otherwise. Neurologist also stated that patient should go back on Asprin. Patient was told to stop taking Asprin after losing a lot of blood in surgery 3 years ago. Patient and his wife are confused by the contradicting opinions. Patient and his wife are requesting to speak with his PCP for his opinion. Please advise.   Copied From CRM [phone] Reason for Triage: Pt's wife is concerned about recent PET scan results that they discussed with neurology. Has concerns and wants to speak to the clinic, also has medication concerns regarding aspirin. Seeking appt with PCP however nothing is available until June.  Best contact: [phone]  Reason for Disposition  [1] Caller requesting NON-URGENT health information AND [2] PCP's office is the best resource  Protocols used: Information Only Call - No Triage-A-AH

## 2024-01-11 ENCOUNTER — Encounter (HOSPITAL_BASED_OUTPATIENT_CLINIC_OR_DEPARTMENT_OTHER): Payer: Self-pay | Admitting: Family Medicine

## 2024-01-11 NOTE — Telephone Encounter (Signed)
 My chart msg sent to patient.

## 2024-02-13 ENCOUNTER — Ambulatory Visit (INDEPENDENT_AMBULATORY_CARE_PROVIDER_SITE_OTHER): Admitting: "Endocrinology

## 2024-02-13 ENCOUNTER — Encounter: Payer: Self-pay | Admitting: "Endocrinology

## 2024-02-13 ENCOUNTER — Telehealth: Payer: Self-pay | Admitting: "Endocrinology

## 2024-02-13 VITALS — BP 160/80 | HR 84 | Ht 70.5 in | Wt 174.0 lb

## 2024-02-13 DIAGNOSIS — Z794 Long term (current) use of insulin: Secondary | ICD-10-CM

## 2024-02-13 DIAGNOSIS — E11649 Type 2 diabetes mellitus with hypoglycemia without coma: Secondary | ICD-10-CM

## 2024-02-13 DIAGNOSIS — E7800 Pure hypercholesterolemia, unspecified: Secondary | ICD-10-CM

## 2024-02-13 DIAGNOSIS — Z7984 Long term (current) use of oral hypoglycemic drugs: Secondary | ICD-10-CM

## 2024-02-13 LAB — POCT GLYCOSYLATED HEMOGLOBIN (HGB A1C): Hemoglobin A1C: 7.4 % — AB (ref 4.0–5.6)

## 2024-02-13 MED ORDER — DEXCOM G7 RECEIVER DEVI: 1.0000 | 0 refills | Status: DC

## 2024-02-13 MED ORDER — DEXCOM G7 SENSOR MISC: 1.0000 | 0 refills | Status: DC

## 2024-02-13 MED ORDER — EMPAGLIFLOZIN 10 MG PO TABS
10.0000 mg | ORAL_TABLET | Freq: Every day | ORAL | 3 refills | Status: DC
Start: 2024-02-13 — End: 2024-02-14

## 2024-02-13 NOTE — Patient Instructions (Addendum)
 Will recommend the following: Metformin 1000 mg bid Jardiance  10 mg qd Lantus 14 units qam (take 10 units if starting jardiance )  Risks and side effects of SGLT-2 inhibitors including but not limited to yeast infections, urinary tract infections, dehydration, hyperkalemia, euglycemic DKA etc. Patient to notify us  in case of any side effects ______    Goals of DM therapy:  Morning Fasting blood sugar: 80-140  Blood sugar before meals: 80-140 Bed time blood sugar: 100-150  A1C <7%, limited only by hypoglycemia  1.Diabetes medications and their side effects discussed, including hypoglycemia    2. Check blood glucose:  a) Always check blood sugars before driving. Please see below (under hypoglycemia) on how to manage b) Check a minimum of 3 times/day or more as needed when having symptoms of hypoglycemia.   c) Try to check blood glucose before sleeping/in the middle of the night to ensure that it is remaining stable and not dropping less than 100 d) Check blood glucose more often if sick  3. Diet: a) 3 meals per day schedule b: Restrict carbs to 60-70 grams (4 servings) per meal c) Colorful vegetables - 3 servings a day, and low sugar fruit 2 servings/day Plate control method: 1/4 plate protein, 1/4 starch, 1/2 green, yellow, or red vegetables d) Avoid carbohydrate snacks unless hypoglycemic episode, or increased physical activity  4. Regular exercise as tolerated, preferably 3 or more hours a week  5. Hypoglycemia: a)  Do not drive or operate machinery without first testing blood glucose to assure it is over 90 mg%, or if dizzy, lightheaded, not feeling normal, etc, or  if foot or leg is numb or weak. b)  If blood glucose less than 70, take four 5gm Glucose tabs or 15-30 gm Glucose gel.  Repeat every 15 min as needed until blood sugar is >100 mg/dl. If hypoglycemia persists then call 911.   6. Sick day management: a) Check blood glucose more often b) Continue usual therapy if  blood sugars are elevated.   7. Contact the doctor immediately if blood glucose is frequently <60 mg/dl, or an episode of severe hypoglycemia occurs (where someone had to give you glucose/  glucagon or if you passed out from a low blood glucose), or if blood glucose is persistently >350 mg/dl, for further management  8. A change in level of physical activity or exercise and a change in diet may also affect your blood sugar. Check blood sugars more often and call if needed.  Instructions: 1. Bring glucose meter, blood glucose records on every visit for review 2. Continue to follow up with primary care physician and other providers for medical care 3. Yearly eye  and foot exam 4. Please get blood work done prior to the next appointment

## 2024-02-13 NOTE — Telephone Encounter (Signed)
 Patient advises that Dexcom is not covered by insurance.  Pharmacy advised them that the [facility] Jerrilyn Moras is covered.  Also the Jardiance  has a copay of $400 plus dollar. Patient was advised to look for copay card and/or contact insurance to see which medication they do cover.

## 2024-02-13 NOTE — Progress Notes (Signed)
 Outpatient Endocrinology Note Alexander Newcomer, MD  02/13/24   [name] 12-26-1940 [id number]  Referring Provider: de [place], Raymond J, MD Primary Care Provider: de [place], Raymond J, MD Reason for consultation: Subjective   Assessment & Plan  Diagnoses and all orders for this visit:  Type 2 diabetes mellitus with diabetic nephropathy, with long-term current use of insulin (HCC) -     POCT glycosylated hemoglobin (Hb A1C)  Other orders -     Continuous Glucose Sensor (DEXCOM G7 SENSOR) MISC; 1 Device by Does not apply route continuous. -     Continuous Glucose Receiver (DEXCOM G7 RECEIVER) DEVI; 1 Device by Does not apply route continuous. -     empagliflozin  (JARDIANCE ) 10 MG TABS tablet; Take 1 tablet (10 mg total) by mouth daily before breakfast.    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 Patient to notify us  in case of any side effects   -Last LD and Tg are as follows: No results found for: "LDLCALC" No results found for: "TRIG" -On simvastatin 40 mg QD -Follow low fat diet and exercise   -Blood pressure goal <140/90 - Microalbumin/creatinine goal is < 30 -Last MA/Cr is as follows: No results found for: "MICROALBUR", "MALB24HUR" -on ACE/ARB lisinopril 40 mg every day  -diet changes including salt restriction -limit eating outside -counseled BP targets per standards of diabetes care -uncontrolled blood pressure can lead  to retinopathy, nephropathy and cardiovascular and atherosclerotic heart disease  Reviewed and counseled on: -A1C target -Blood sugar targets -Complications of uncontrolled diabetes  -Checking blood sugar before meals and bedtime and bring log next visit -All medications with mechanism of action and side effects -Hypoglycemia management: rule of 15's, Glucagon Emergency Kit and medical alert ID -low-carb low-fat plate-method diet -At least 20 minutes of physical activity per day -Annual dilated retinal eye exam and foot exam -compliance and follow up needs -follow up as scheduled or earlier if problem gets worse  Call if blood sugar is less than 70 or consistently above 250    Take a 15 gm snack of carbohydrate at bedtime before you go to sleep if your blood sugar is less than 100.    If you are going to fast after midnight for a test or procedure, ask your physician for instructions on how to reduce/decrease your insulin dose.    Call if blood sugar is less than 70 or consistently above 250  -Treating a low sugar by rule of 15  (15 gms of sugar every 15 min until sugar is more than 70) If you feel your sugar is low, test your sugar to be sure If your sugar is low (less than 70), then take 15 grams of a fast acting Carbohydrate (3-4 glucose tablets or glucose gel or 4 ounces of juice or regular soda) Recheck your sugar 15 min after treating low to make sure it is more than 70 If sugar is still less than 70, treat again with 15 grams of carbohydrate  Don't drive the hour of hypoglycemia  If unconscious/unable to eat or drink by mouth, use glucagon injection or nasal spray baqsimi and call 911. Can repeat again in 15 min if still unconscious.  Return in about 4 weeks (around 03/12/2024).   I have reviewed current medications, nurse's notes, allergies, vital signs, past medical and surgical history, family medical history, and social history for this encounter. Counseled patient  on symptoms, examination findings, lab findings, imaging results, treatment decisions and monitoring and prognosis. The patient understood the recommendations and agrees with the treatment plan. All questions regarding treatment plan were fully answered.  Alexander Newcomer, MD  02/13/24   History of Present Illness Alexander Peterson is a 83 y.o. year old male who presents for evaluation of Type II diabetes mellitus.  Alexander Peterson was first diagnosed in 1987.    Home diabetes regimen: Metformin 1000 mg bid Lantus 17 units qam   COMPLICATIONS -  MI/Stroke +  retinopathy -  neuropathy -  nephropathy/+ microalbuminuria   SYMPTOMS REVIEWED - Polyuria - Weight loss - Blurred vision  07/2023 MA/Cr 49  BLOOD SUGAR DATA Checks fasting and bedtime Did not bring meter Per recall 60-130  Physical Exam  BP (!) 160/80   Pulse 84   Ht 5' 10.5" (1.791 m)   Wt 174 lb (78.9 kg)   SpO2 99%   BMI 24.61 kg/m    Constitutional: well developed, well nourished Head: normocephalic, atraumatic Eyes: sclera anicteric, no redness Neck: supple Lungs: normal respiratory effort Neurology: alert and oriented Skin: dry, no appreciable rashes Musculoskeletal: no appreciable defects Psychiatric: normal mood and affect Diabetic Foot Exam - Simple   No data filed      Current Medications Patient's Medications  New Prescriptions   CONTINUOUS GLUCOSE RECEIVER (DEXCOM G7 RECEIVER) DEVI    1 Device by Does not apply route continuous.   CONTINUOUS GLUCOSE SENSOR (DEXCOM G7 SENSOR) MISC    1 Device by Does not apply route continuous.   EMPAGLIFLOZIN  (JARDIANCE ) 10 MG TABS TABLET    Take 1 tablet (10 mg total) by mouth daily before breakfast.  Previous Medications   ACCU-CHEK AVIVA PLUS TEST STRIP    2 (two) times daily.   CALCIUM CARB-CHOLECALCIFEROL (CALCIUM 600+D3) 600-200 MG-UNIT TABS    Take 1 tablet by mouth 2 (two) times daily. With lunch and dinner   CYANOCOBALAMIN (VITAMIN B12) 500 MCG TABLET     Take 2,500 mcg by mouth daily. Pt. Takes a half once daily.   FERROUS SULFATE 325 (65 FE) MG TABLET    Take 325 mg by mouth 3 (three) times a week.   GLUCOSAMINE-CHONDROITIN 500-400 MG TABLET    Take 1 tablet by mouth once.   INSULIN GLARGINE (LANTUS) 100 UNIT/ML INJECTION    Inject 0-12 Units into the skin 2 (two) times daily. Pt. Takes 17 units in the morning.   LISINOPRIL (PRINIVIL,ZESTRIL) 40 MG TABLET    Take 40 mg by mouth daily.   METFORMIN (GLUCOPHAGE) 1000 MG TABLET    Take 1,000 mg by mouth 2 (two) times daily.   MULTIPLE VITAMIN (MULTIVITAMIN) TABLET    Take 1 tablet by mouth daily.   MULTIPLE VITAMINS-MINERALS (ICAPS AREDS 2 PO)    Take by mouth.   PSEUDOEPHEDRINE (SUDAFED) 120 MG 12 HR TABLET    Take 120 mg by mouth 2 (two) times daily.   SIMVASTATIN (ZOCOR) 40 MG TABLET    Take 40 mg by mouth daily.   VITAMIN E 400 UNIT CAPSULE  Take 400 Units by mouth every other day.  Modified Medications   No medications on file  Discontinued Medications   No medications on file    Allergies No Known Allergies  Past Medical History Past Medical History:  Diagnosis Date   Diabetes mellitus without complication (HCC)    Hypercholesteremia    Hypertension    Skin cancer     Past Surgical History Past Surgical History:  Procedure Laterality Date   upper instetine removed  2020    Family History family history includes Cancer in his mother; Heart attack in his mother; Heart disease in his mother; Stroke in his sister.  Social History Social History   Socioeconomic History   Marital status: Married    Spouse name: Not on file   Number of children: 2   Years of education: Not on file   Highest education level: Doctorate  Occupational History   Not on file  Tobacco Use   Smoking status: Never    Passive exposure: Never   Smokeless tobacco: Never  Vaping Use   Vaping status: Never Used  Substance and Sexual Activity   Alcohol use: No   Drug use: No   Sexual  activity: Not Currently    Partners: Female    Comment: MARRIED  Other Topics Concern   Not on file  Social History Narrative   Retired Surveyor, minerals   Social Drivers of Health   Financial Resource Strain: Low Risk  (11/22/2023)   Overall Financial Resource Strain (CARDIA)    Difficulty of Paying Living Expenses: Not hard at all  Food Insecurity: No Food Insecurity (11/22/2023)   Hunger Vital Sign    Worried About Running Out of Food in the Last Year: Never true    Ran Out of Food in the Last Year: Never true  Transportation Needs: No Transportation Needs (11/22/2023)   PRAPARE - Administrator, Civil Service (Medical): No    Lack of Transportation (Non-Medical): No  Physical Activity: Sufficiently Active (11/22/2023)   Exercise Vital Sign    Days of Exercise per Week: 6 days    Minutes of Exercise per Session: 30 min  Stress: No Stress Concern Present (11/22/2023)   [employer] of Occupational Health - Occupational Stress Questionnaire    Feeling of Stress : Not at all  Social Connections: Moderately Isolated (11/22/2023)   Social Connection and Isolation Panel [NHANES]    Frequency of Communication with Friends and Family: Twice a week    Frequency of Social Gatherings with Friends and Family: Never    Attends Religious Services: More than 4 times per year    Active Member of [employer] or Organizations: No    Attends Banker Meetings: Never    Marital Status: Married  Catering manager Violence: Not At Risk (11/22/2023)   Humiliation, Afraid, Rape, and Kick questionnaire    Fear of Current or Ex-Partner: No    Emotionally Abused: No    Physically Abused: No    Sexually Abused: No    Lab Results  Component Value Date   HGBA1C 7.4 (A) 02/13/2024   HGBA1C 7.4 04/08/2023   No results found for: "CHOL" No results found for: "HDL" No results found for: "LDLCALC" No results found for: "TRIG" No results found for: "CHOLHDL" Lab Results  Component  Value Date   CREATININE 1.05 07/30/2023   No results found for: "GFR" No results found for: "MICROALBUR", "MALB24HUR"    Component Value Date/Time   NA 126 (  L) 07/30/2023 0934   K 4.5 07/30/2023 0934   CL 92 (L) 07/30/2023 0934   CO2 25 07/30/2023 0934   GLUCOSE 221 (H) 07/30/2023 0934   BUN 11 07/30/2023 0934   CREATININE 1.05 07/30/2023 0934   CALCIUM 8.4 (L) 07/30/2023 0934   PROT 8.1 02/20/2018 0446   ALBUMIN 4.0 02/20/2018 0446   AST 26 02/20/2018 0446   ALT 15 (L) 02/20/2018 0446   ALKPHOS 49 02/20/2018 0446   BILITOT 0.5 02/20/2018 0446   GFRNONAA >60 07/30/2023 0934   GFRAA >60 03/02/2018 1601      Latest Ref Rng & Units 07/30/2023    9:34 AM 03/02/2018    4:01 PM 03/02/2018    2:51 PM  BMP  Glucose 70 - 99 mg/dL 914  782  956   BUN 8 - 23 mg/dL 11  10  10    Creatinine 0.61 - 1.24 mg/dL 2.13  0.86  5.78   Sodium 135 - 145 mmol/L 126  125  124   Potassium 3.5 - 5.1 mmol/L 4.5  4.1  4.2   Chloride 98 - 111 mmol/L 92  92  89   CO2 22 - 32 mmol/L 25  24  24    Calcium 8.9 - 10.3 mg/dL 8.4  8.2  8.8        Component Value Date/Time   WBC 8.2 07/30/2023 0934   RBC 4.06 (L) 07/30/2023 0934   HGB 11.9 (L) 07/30/2023 0934   HCT 36.8 (L) 07/30/2023 0934   PLT 178 07/30/2023 0934   MCV 90.6 07/30/2023 0934   MCH 29.3 07/30/2023 0934   MCHC 32.3 07/30/2023 0934   RDW 12.7 07/30/2023 0934     Parts of this note may have been dictated using voice recognition software. There may be variances in spelling and vocabulary which are unintentional. Not all errors are proofread. Please notify the [facility] if any discrepancies are noted or if the meaning of any statement is not clear.

## 2024-02-14 ENCOUNTER — Other Ambulatory Visit: Payer: Self-pay | Admitting: "Endocrinology

## 2024-02-14 MED ORDER — FREESTYLE LIBRE 3 READER DEVI: 1.0000 | 0 refills | Status: DC

## 2024-02-14 MED ORDER — FREESTYLE LIBRE 3 PLUS SENSOR MISC: 3 refills | Status: DC

## 2024-02-14 MED ORDER — DAPAGLIFLOZIN PROPANEDIOL 5 MG PO TABS: 5.0000 mg | ORAL_TABLET | Freq: Every day | ORAL | 0 refills | Status: DC

## 2024-02-14 NOTE — Telephone Encounter (Signed)
 Lvm for pt to call back regarding new Rx sent to pharmacy.

## 2024-02-17 ENCOUNTER — Telehealth: Payer: Self-pay

## 2024-02-17 NOTE — Telephone Encounter (Signed)
 Pt is start on dexcom G7. Please start PA.

## 2024-02-22 ENCOUNTER — Telehealth: Payer: Self-pay

## 2024-02-22 NOTE — Telephone Encounter (Signed)
 Pt call office regarding  medication farxiga 5 mg. Pt was concerned about taking medication with his lantus due to the warning on label stating not to take farxgia and luntus together. I advise Dr Vertell Gory regarding medications, Dr Vertell Gory stated that pt can take medication together just to watch out for low blood sugar readings. I relayed informed to pt. I also informed pt per Dr Vertell Gory recommendation that once he starts taking farxiga to drop his lantus back down to 10 units daily and if blood sugar gets less then 70 to stop lantus all together and to notify Dr Vertell Gory.

## 2024-02-28 ENCOUNTER — Telehealth: Payer: Self-pay

## 2024-02-28 ENCOUNTER — Encounter: Payer: Self-pay | Admitting: "Endocrinology

## 2024-02-28 NOTE — Telephone Encounter (Signed)
 Pt needs PA done for [facility]

## 2024-02-29 ENCOUNTER — Other Ambulatory Visit (HOSPITAL_COMMUNITY): Payer: Self-pay

## 2024-02-29 ENCOUNTER — Telehealth: Payer: Self-pay

## 2024-02-29 NOTE — Telephone Encounter (Signed)
 Pharmacy Patient Advocate Encounter   Received notification from Pt Calls Messages that prior authorization for Dexcom G7 sensor is required/requested.   Insurance verification completed.   The patient is insured through [facility] .   Per test claim: Medication is not eligible for pharmacy benefits and must be billed through medical insurance. As our team only handles pharmacy related prior auths, medical PA's must be submitted by the clinic. Thank you   I also see a note that says insurance prefers [facility], and it looks like that is what he has been switched to. Jerrilyn Moras also is not covered under part D

## 2024-03-01 ENCOUNTER — Encounter: Payer: Self-pay | Admitting: "Endocrinology

## 2024-03-01 NOTE — Telephone Encounter (Signed)
 Spoke with pt regarding Dexcom G7 not covered by [employer] sent in to pharmacy.

## 2024-03-01 NOTE — Telephone Encounter (Signed)
 Lvm for pt to call back.

## 2024-03-08 ENCOUNTER — Other Ambulatory Visit

## 2024-03-12 ENCOUNTER — Encounter: Payer: Self-pay | Admitting: "Endocrinology

## 2024-03-12 ENCOUNTER — Ambulatory Visit (INDEPENDENT_AMBULATORY_CARE_PROVIDER_SITE_OTHER): Admitting: "Endocrinology

## 2024-03-12 VITALS — BP 120/74 | HR 80 | Ht 70.5 in | Wt 168.0 lb

## 2024-03-12 DIAGNOSIS — E11649 Type 2 diabetes mellitus with hypoglycemia without coma: Secondary | ICD-10-CM | POA: Diagnosis not present

## 2024-03-12 DIAGNOSIS — Z794 Long term (current) use of insulin: Secondary | ICD-10-CM

## 2024-03-12 DIAGNOSIS — E7800 Pure hypercholesterolemia, unspecified: Secondary | ICD-10-CM | POA: Diagnosis not present

## 2024-03-12 DIAGNOSIS — Z7984 Long term (current) use of oral hypoglycemic drugs: Secondary | ICD-10-CM | POA: Diagnosis not present

## 2024-03-12 MED ORDER — DAPAGLIFLOZIN PROPANEDIOL 10 MG PO TABS: 10.0000 mg | ORAL_TABLET | Freq: Every day | ORAL | 1 refills | Status: DC

## 2024-03-12 NOTE — Patient Instructions (Signed)
 Will recommend the following: Metformin 1000 mg bid Farxiga  10 mg qd Lantus 10 units once a day if blood sugar is more than 150

## 2024-03-12 NOTE — Progress Notes (Signed)
 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 Patient to notify us  in case of any side effects  -Last LD and Tg are as follows: Lab Results  Component Value Date   LDLCALC 55 03/08/2024    Lab Results  Component Value Date   TRIG 75 03/08/2024   -On simvastatin 40 mg QD -Follow low fat diet and exercise   -Blood pressure goal <140/90 - Microalbumin/creatinine goal is < 30 -Last MA/Cr is as follows: Lab Results  Component Value Date   MICROALBUR 9.3 03/08/2024   -on ACE/ARB lisinopril 40 mg every day  -diet changes including salt restriction -limit eating outside -counseled BP targets per standards of diabetes care -uncontrolled  blood pressure can lead to retinopathy, nephropathy and cardiovascular and atherosclerotic heart disease  Reviewed and counseled on: -A1C target -Blood sugar targets -Complications of uncontrolled diabetes  -Checking blood sugar before meals and bedtime and bring log next visit -All medications with mechanism of action and side effects -Hypoglycemia management: rule of 15's, Glucagon Emergency Kit and medical alert ID -low-carb low-fat plate-method diet -At least 20 minutes of physical activity per day -Annual dilated retinal eye exam and foot exam -compliance and follow up needs -follow up as scheduled or earlier if problem gets worse  Call if blood sugar is less than 70 or consistently above 250    Take a 15 gm snack of carbohydrate at bedtime before you go to sleep if your blood sugar is less than 100.    If you are going to fast after midnight for a test or procedure, ask your physician for instructions on how to reduce/decrease your insulin dose.    Call if blood sugar is less than 70 or consistently above 250  -Treating a low sugar by rule of 15  (15 gms of sugar every 15 min until sugar is more than 70) If you feel your sugar is low, test your sugar to be sure If your sugar is low (less than 70), then take 15 grams of a fast acting Carbohydrate (3-4 glucose tablets or glucose gel or 4 ounces of juice or regular soda) Recheck your sugar 15 min after treating low to make sure it is more than 70 If sugar is still less  than 70, treat again with 15 grams of carbohydrate          Don't drive the hour of hypoglycemia  If unconscious/unable to eat or drink by mouth, use glucagon injection or nasal spray baqsimi and call 911. Can repeat again in 15 min if still unconscious.  Return in about 3 months (around 06/12/2024).   I have reviewed current medications, nurse's notes, allergies, vital signs, past medical and surgical history, family medical history, and social history for this  encounter. Counseled patient on symptoms, examination findings, lab findings, imaging results, treatment decisions and monitoring and prognosis. The patient understood the recommendations and agrees with the treatment plan. All questions regarding treatment plan were fully answered.  Jorge Newcomer, MD  03/12/24   History of Present Illness Alexander Peterson is a 83 y.o. year old male who presents for follow up of Type II diabetes mellitus.  Alexander Peterson was first diagnosed in 1987.    Home diabetes regimen: Metformin 1000 mg bid Farxiga  10 mg qd Lantus 10 units qam   COMPLICATIONS -  MI/Stroke +  retinopathy -  neuropathy -  nephropathy/+ microalbuminuria   SYMPTOMS REVIEWED - Polyuria - Weight loss - Blurred vision  07/2023 MA/Cr 49  BLOOD SUGAR DATA Checks fasting and bedtime Did not bring meter Per recall 60-130  Physical Exam  BP 120/74   Pulse 80   Ht 5' 10.5 (1.791 m)   Wt 168 lb (76.2 kg)   SpO2 98%   BMI 23.76 kg/m    Constitutional: well developed, well nourished Head: normocephalic, atraumatic Eyes: sclera anicteric, no redness Neck: supple Lungs: normal respiratory effort Neurology: alert and oriented Skin: dry, no appreciable rashes Musculoskeletal: no appreciable defects Psychiatric: normal mood and affect Diabetic Foot Exam - Simple   Simple Foot Form Diabetic Foot exam was performed with the following findings: Yes 03/12/2024  9:41 AM  Visual Inspection No deformities, no ulcerations, no other skin breakdown bilaterally: Yes Sensation Testing Intact to touch and monofilament testing bilaterally: Yes Pulse Check Posterior Tibialis and Dorsalis pulse intact bilaterally: Yes Comments 1/3 Monofilament B/L       Current Medications Patient's Medications  New Prescriptions   DAPAGLIFLOZIN  PROPANEDIOL (FARXIGA ) 10 MG TABS TABLET    Take 1 tablet (10 mg total) by mouth daily before breakfast.  Previous Medications   ACCU-CHEK AVIVA PLUS TEST  STRIP    2 (two) times daily.   CALCIUM CARB-CHOLECALCIFEROL (CALCIUM 600+D3) 600-200 MG-UNIT TABS    Take 1 tablet by mouth 2 (two) times daily. With lunch and dinner   CONTINUOUS GLUCOSE RECEIVER (FREESTYLE LIBRE 3 READER) DEVI    1 Device by Does not apply route continuous.   CONTINUOUS GLUCOSE SENSOR (FREESTYLE LIBRE 3 PLUS SENSOR) MISC    Change sensor every 15 days.   CYANOCOBALAMIN (VITAMIN B12) 500 MCG TABLET    Take 2,500 mcg by mouth daily. Pt. Takes a half once daily.   FERROUS SULFATE 325 (65 FE) MG TABLET    Take 325 mg by mouth 3 (three) times a week.   GLUCOSAMINE-CHONDROITIN 500-400 MG TABLET    Take 1 tablet by mouth once.   INSULIN GLARGINE (LANTUS) 100 UNIT/ML INJECTION    Inject 0-12 Units into the skin 2 (two) times daily. Pt. Takes 17 units in the morning.   LISINOPRIL (PRINIVIL,ZESTRIL) 40 MG TABLET    Take 40 mg by mouth daily.   METFORMIN (GLUCOPHAGE) 1000 MG TABLET    Take 1,000 mg by mouth 2 (two)  times daily.   MULTIPLE VITAMIN (MULTIVITAMIN) TABLET    Take 1 tablet by mouth daily.   MULTIPLE VITAMINS-MINERALS (ICAPS AREDS 2 PO)    Take by mouth.   PSEUDOEPHEDRINE (SUDAFED) 120 MG 12 HR TABLET    Take 120 mg by mouth 2 (two) times daily.   SIMVASTATIN (ZOCOR) 40 MG TABLET    Take 40 mg by mouth daily.   VITAMIN E 400 UNIT CAPSULE    Take 400 Units by mouth every other day.  Modified Medications   No medications on file  Discontinued Medications   DAPAGLIFLOZIN  PROPANEDIOL (FARXIGA ) 5 MG TABS TABLET    Take 1 tablet (5 mg total) by mouth daily.    Allergies No Known Allergies  Past Medical History Past Medical History:  Diagnosis Date   Diabetes mellitus without complication (HCC)    Hypercholesteremia    Hypertension    Skin cancer     Past Surgical History Past Surgical History:  Procedure Laterality Date   upper instetine removed  2020    Family History family history includes Cancer in his mother; Heart attack in his mother; Heart disease in his  mother; Stroke in his sister.  Social History Social History   Socioeconomic History   Marital status: Married    Spouse name: Not on file   Number of children: 2   Years of education: Not on file   Highest education level: Doctorate  Occupational History   Not on file  Tobacco Use   Smoking status: Never    Passive exposure: Never   Smokeless tobacco: Never  Vaping Use   Vaping status: Never Used  Substance and Sexual Activity   Alcohol use: No   Drug use: No   Sexual activity: Not Currently    Partners: Female    Comment: MARRIED  Other Topics Concern   Not on file  Social History Narrative   Retired Surveyor, minerals   Social Drivers of Health   Financial Resource Strain: Low Risk  (11/22/2023)   Overall Financial Resource Strain (CARDIA)    Difficulty of Paying Living Expenses: Not hard at all  Food Insecurity: No Food Insecurity (11/22/2023)   Hunger Vital Sign    Worried About Running Out of Food in the Last Year: Never true    Ran Out of Food in the Last Year: Never true  Transportation Needs: No Transportation Needs (11/22/2023)   PRAPARE - Administrator, Civil Service (Medical): No    Lack of Transportation (Non-Medical): No  Physical Activity: Sufficiently Active (11/22/2023)   Exercise Vital Sign    Days of Exercise per Week: 6 days    Minutes of Exercise per Session: 30 min  Stress: No Stress Concern Present (11/22/2023)   [employer] of Occupational Health - Occupational Stress Questionnaire    Feeling of Stress : Not at all  Social Connections: Moderately Isolated (11/22/2023)   Social Connection and Isolation Panel    Frequency of Communication with Friends and Family: Twice a week    Frequency of Social Gatherings with Friends and Family: Never    Attends Religious Services: More than 4 times per year    Active Member of [employer] or Organizations: No    Attends Banker Meetings: Never    Marital Status: Married  Careers information officer Violence: Not At Risk (11/22/2023)   Humiliation, Afraid, Rape, and Kick questionnaire    Fear of Current or Ex-Partner: No    Emotionally Abused: No  Physically Abused: No    Sexually Abused: No    Lab Results  Component Value Date   HGBA1C 7.4 (A) 02/13/2024   HGBA1C 7.4 04/08/2023   Lab Results  Component Value Date   CHOL 119 03/08/2024   Lab Results  Component Value Date   HDL 48 03/08/2024   Lab Results  Component Value Date   LDLCALC 55 03/08/2024   Lab Results  Component Value Date   TRIG 75 03/08/2024   Lab Results  Component Value Date   CHOLHDL 2.5 03/08/2024   Lab Results  Component Value Date   CREATININE 1.31 (H) 03/08/2024   No results found for: GFR Lab Results  Component Value Date   MICROALBUR 9.3 03/08/2024      Component Value Date/Time   NA 136 03/08/2024 0803   K 4.5 03/08/2024 0803   CL 100 03/08/2024 0803   CO2 25 03/08/2024 0803   GLUCOSE 128 (H) 03/08/2024 0803   BUN 16 03/08/2024 0803   CREATININE 1.31 (H) 03/08/2024 0803   CALCIUM 9.9 03/08/2024 0803   PROT 7.6 03/08/2024 0803   ALBUMIN 4.0 02/20/2018 0446   AST 23 03/08/2024 0803   ALT 14 03/08/2024 0803   ALKPHOS 49 02/20/2018 0446   BILITOT 0.6 03/08/2024 0803   GFRNONAA >60 07/30/2023 0934   GFRAA >60 03/02/2018 1601      Latest Ref Rng & Units 03/08/2024    8:03 AM 07/30/2023    9:34 AM 03/02/2018    4:01 PM  BMP  Glucose 65 - 99 mg/dL 865  784  696   BUN 7 - 25 mg/dL 16  11  10    Creatinine 0.70 - 1.22 mg/dL 2.95  2.84  1.32   BUN/Creat Ratio 6 - 22 (calc) 12     Sodium 135 - 146 mmol/L 136  126  125   Potassium 3.5 - 5.3 mmol/L 4.5  4.5  4.1   Chloride 98 - 110 mmol/L 100  92  92   CO2 20 - 32 mmol/L 25  25  24    Calcium 8.6 - 10.3 mg/dL 9.9  8.4  8.2        Component Value Date/Time   WBC 8.2 07/30/2023 0934   RBC 4.06 (L) 07/30/2023 0934   HGB 11.9 (L) 07/30/2023 0934   HCT 36.8 (L) 07/30/2023 0934   PLT 178 07/30/2023 0934   MCV 90.6  07/30/2023 0934   MCH 29.3 07/30/2023 0934   MCHC 32.3 07/30/2023 0934   RDW 12.7 07/30/2023 0934     Parts of this note may have been dictated using voice recognition software. There may be variances in spelling and vocabulary which are unintentional. Not all errors are proofread. Please notify the [facility] if any discrepancies are noted or if the meaning of any statement is not clear.

## 2024-03-14 ENCOUNTER — Encounter: Payer: Self-pay | Admitting: "Endocrinology

## 2024-03-15 NOTE — Telephone Encounter (Signed)
 Called pt regarding fluid restriction informed pt that he needs to call the Dr who started the fluid restriction.

## 2024-03-19 ENCOUNTER — Other Ambulatory Visit: Payer: Self-pay | Admitting: "Endocrinology

## 2024-03-27 ENCOUNTER — Other Ambulatory Visit (HOSPITAL_BASED_OUTPATIENT_CLINIC_OR_DEPARTMENT_OTHER): Payer: Medicare Other

## 2024-03-27 ENCOUNTER — Other Ambulatory Visit (HOSPITAL_BASED_OUTPATIENT_CLINIC_OR_DEPARTMENT_OTHER): Payer: Self-pay | Admitting: *Deleted

## 2024-03-27 DIAGNOSIS — Z0000 Encounter for general adult medical examination without abnormal findings: Secondary | ICD-10-CM

## 2024-03-27 DIAGNOSIS — I10 Essential (primary) hypertension: Secondary | ICD-10-CM

## 2024-03-27 DIAGNOSIS — Z794 Long term (current) use of insulin: Secondary | ICD-10-CM

## 2024-03-28 ENCOUNTER — Ambulatory Visit (HOSPITAL_BASED_OUTPATIENT_CLINIC_OR_DEPARTMENT_OTHER): Payer: Self-pay | Admitting: Family Medicine

## 2024-03-28 LAB — COMPREHENSIVE METABOLIC PANEL WITH GFR
ALT: 10 IU/L (ref 0–44)
AST: 17 IU/L (ref 0–40)
Albumin: 4.1 g/dL (ref 3.7–4.7)
Alkaline Phosphatase: 37 IU/L — ABNORMAL LOW (ref 44–121)
BUN/Creatinine Ratio: 13 (ref 10–24)
BUN: 15 mg/dL (ref 8–27)
Bilirubin Total: 0.5 mg/dL (ref 0.0–1.2)
CO2: 21 mmol/L (ref 20–29)
Calcium: 9.2 mg/dL (ref 8.6–10.2)
Chloride: 97 mmol/L (ref 96–106)
Creatinine, Ser: 1.15 mg/dL (ref 0.76–1.27)
Globulin, Total: 2.9 g/dL (ref 1.5–4.5)
Glucose: 111 mg/dL — ABNORMAL HIGH (ref 70–99)
Potassium: 4.3 mmol/L (ref 3.5–5.2)
Sodium: 135 mmol/L (ref 134–144)
Total Protein: 7 g/dL (ref 6.0–8.5)
eGFR: 62 mL/min/{1.73_m2} (ref >59)

## 2024-03-28 LAB — CBC WITH DIFFERENTIAL/PLATELET
Basophils Absolute: 0.1 10*3/uL (ref 0.0–0.2)
Basos: 1 %
EOS (ABSOLUTE): 0.3 10*3/uL (ref 0.0–0.4)
Eos: 4 %
Hematocrit: 37.8 % (ref 37.5–51.0)
Hemoglobin: 12.6 g/dL — ABNORMAL LOW (ref 13.0–17.7)
Immature Grans (Abs): 0 10*3/uL (ref 0.0–0.1)
Immature Granulocytes: 0 %
Lymphocytes Absolute: 3.5 10*3/uL — ABNORMAL HIGH (ref 0.7–3.1)
Lymphs: 51 %
MCH: 30.9 pg (ref 26.6–33.0)
MCHC: 33.3 g/dL (ref 31.5–35.7)
MCV: 93 fL (ref 79–97)
Monocytes Absolute: 0.5 10*3/uL (ref 0.1–0.9)
Monocytes: 7 %
Neutrophils Absolute: 2.6 10*3/uL (ref 1.4–7.0)
Neutrophils: 37 %
Platelets: 196 10*3/uL (ref 150–450)
RBC: 4.08 x10E6/uL — ABNORMAL LOW (ref 4.14–5.80)
RDW: 12.8 % (ref 11.6–15.4)
WBC: 7 10*3/uL (ref 3.4–10.8)

## 2024-03-28 LAB — HEMOGLOBIN A1C
Est. average glucose Bld gHb Est-mCnc: 163 mg/dL
Hgb A1c MFr Bld: 7.3 % — ABNORMAL HIGH (ref 4.8–5.6)

## 2024-03-28 LAB — LIPID PANEL
Chol/HDL Ratio: 2.8 ratio (ref 0.0–5.0)
Cholesterol, Total: 118 mg/dL (ref 100–199)
HDL: 42 mg/dL (ref >39)
LDL Chol Calc (NIH): 58 mg/dL (ref 0–99)
Triglycerides: 92 mg/dL (ref 0–149)
VLDL Cholesterol Cal: 18 mg/dL (ref 5–40)

## 2024-03-28 LAB — TSH RFX ON ABNORMAL TO FREE T4: TSH: 0.471 u[IU]/mL (ref 0.450–4.500)

## 2024-04-03 ENCOUNTER — Encounter (HOSPITAL_BASED_OUTPATIENT_CLINIC_OR_DEPARTMENT_OTHER): Payer: Self-pay | Admitting: Family Medicine

## 2024-04-03 ENCOUNTER — Ambulatory Visit (INDEPENDENT_AMBULATORY_CARE_PROVIDER_SITE_OTHER): Admitting: Podiatry

## 2024-04-03 ENCOUNTER — Encounter: Payer: Self-pay | Admitting: Podiatry

## 2024-04-03 ENCOUNTER — Ambulatory Visit (INDEPENDENT_AMBULATORY_CARE_PROVIDER_SITE_OTHER): Payer: Medicare Other | Admitting: Family Medicine

## 2024-04-03 VITALS — BP 136/72 | HR 96 | Ht 70.5 in | Wt 160.6 lb

## 2024-04-03 DIAGNOSIS — Z0000 Encounter for general adult medical examination without abnormal findings: Secondary | ICD-10-CM | POA: Diagnosis not present

## 2024-04-03 DIAGNOSIS — B353 Tinea pedis: Secondary | ICD-10-CM

## 2024-04-03 DIAGNOSIS — G5790 Unspecified mononeuropathy of unspecified lower limb: Secondary | ICD-10-CM | POA: Diagnosis not present

## 2024-04-03 DIAGNOSIS — E0842 Diabetes mellitus due to underlying condition with diabetic polyneuropathy: Secondary | ICD-10-CM | POA: Diagnosis not present

## 2024-04-03 MED ORDER — KETOCONAZOLE 2 % EX CREA: TOPICAL_CREAM | CUTANEOUS | 9 refills | Status: DC

## 2024-04-03 NOTE — Addendum Note (Signed)
 Addended by: GIB MABLE SAUNDERS on: 04/03/2024 04:29 PM   Modules accepted: Orders

## 2024-04-03 NOTE — Progress Notes (Signed)
 Patient presents today complaining of warm sensation or thick sensation in the feet bilaterally.  Is a type II diabetic.  Also complains of a rash on the plantar aspect of the feet Iletin thick dystrophic nails bilaterally.   Physical exam:  General appearance: Pleasant, and in no acute distress. AOx3.  Vascular: Pedal pulses: DP 2/4 bilaterally, PT 2/4 bilaterally.  Mild edema lower legs bilaterally. Capillary fill time immediate bilaterally.  Neurological: Light touch intact feet bilaterally.  Normal Achilles reflex bilaterally.  No clonus or spasticity noted.  Slightly diminished vibratory sensation bilaterally feet.  Monofilament sensation intact feet bilaterally  Dermatologic:   Skin normal temperature bilaterally.  Erythematous scaly rash in a moccasin distribution feet bilaterally.  Skin thin and atrophic with hair growth lower extremity.  Atrophic discolored nails with subungual debris 1 through 5 bilaterally  Musculoskeletal: Mild hammertoe deformities 2 through 5 bilaterally good muscle strength foot and ankle with good range of motion subtalar joint and ankle joint bilaterally    Diagnosis: 1 tinea pedis bilaterally. 2.  Neuritis feet bilaterally.   3.  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 - Discussed tinea pedis and treatment of this.  Also discussed the onychomycotic nails discussed with him that this can be treated with Lamisil.  Would like to hold off on the oral Lamisil right now.  Instead we will just try topical ketoconazole  for the feet for the tinea pedis bilaterally.  Discussed precautions and needs to take as a diabetic regarding neuropathy.  I will forego any treatment at this time for the neuropathy.  Discussed with him the with him the importance of keeping his blood sugar under control. -Rx ketoconazole  cream, apply twice daily to affected area, 120 g, refill x  9  Return as needed

## 2024-04-03 NOTE — Progress Notes (Signed)
 Subjective:    CC: Annual Physical Exam  HPI: Alexander Peterson is a 83 y.o. presenting for annual physical  I reviewed the past medical history, family history, social history, surgical history, and allergies today and no changes were needed.  Please see the problem list section below in epic for further details.  Past Medical History: Past Medical History:  Diagnosis Date   Arthritis    Diabetes mellitus without complication (HCC)    Hypercholesteremia    Hypertension    Skin cancer    Past Surgical History: Past Surgical History:  Procedure Laterality Date   COLON SURGERY  04/2021   [place]   EYE SURGERY     upper instetine removed  2020   Social History: Social History   Socioeconomic History   Marital status: Married    Spouse name: Not on file   Number of children: 2   Years of education: Not on file   Highest education level: Doctorate  Occupational History   Not on file  Tobacco Use   Smoking status: Never    Passive exposure: Never   Smokeless tobacco: Never  Vaping Use   Vaping status: Never Used  Substance and Sexual Activity   Alcohol use: No   Drug use: No   Sexual activity: Not Currently    Partners: Female    Comment: MARRIED  Other Topics Concern   Not on file  Social History Narrative   Retired Surveyor, minerals   Social Drivers of Health   Financial Resource Strain: Low Risk  (11/22/2023)   Overall Financial Resource Strain (CARDIA)    Difficulty of Paying Living Expenses: Not hard at all  Food Insecurity: No Food Insecurity (11/22/2023)   Hunger Vital Sign    Worried About Running Out of Food in the Last Year: Never true    Ran Out of Food in the Last Year: Never true  Transportation Needs: No Transportation Needs (11/22/2023)   PRAPARE - Administrator, Civil Service (Medical): No    Lack of Transportation (Non-Medical): No  Physical Activity: Sufficiently Active (11/22/2023)   Exercise Vital Sign    Days of Exercise per  Week: 6 days    Minutes of Exercise per Session: 30 min  Stress: No Stress Concern Present (11/22/2023)   [employer] of Occupational Health - Occupational Stress Questionnaire    Feeling of Stress : Not at all  Social Connections: Moderately Isolated (11/22/2023)   Social Connection and Isolation Panel    Frequency of Communication with Friends and Family: Twice a week    Frequency of Social Gatherings with Friends and Family: Never    Attends Religious Services: More than 4 times per year    Active Member of [employer] or Organizations: No    Attends Engineer, structural: Never    Marital Status: Married   Family History: Family History  Problem Relation Age of Onset   Heart disease Mother        CABG   Heart attack Mother    Cancer Mother    Stroke Sister    Allergies: No Known Allergies Medications: See med rec.  Review of Systems: No headache, visual changes, nausea, vomiting, diarrhea, constipation, dizziness, abdominal pain, skin rash, fevers, chills, night sweats, swollen lymph nodes, weight loss, chest pain, body aches, joint swelling, muscle aches, shortness of breath, mood changes, visual or auditory hallucinations.  Objective:    BP 136/72 (BP Location: Right Arm, Patient Position: Sitting, Cuff Size: Normal)  Pulse 96   Ht 5' 10.5 (1.791 m)   Wt 160 lb 9.6 oz (72.8 kg)   SpO2 100%   BMI 22.72 kg/m   General: Well Developed, well nourished, and in no acute distress. Neuro: Alert and oriented x3, extra-ocular muscles intact, sensation grossly intact. Cranial nerves II through XII are intact, motor, sensory, and coordinative functions are all intact. HEENT: Normocephalic, atraumatic, pupils equal round reactive to light, neck supple, no masses, no lymphadenopathy, thyroid  nonpalpable. Oropharynx, nasopharynx, external ear canals are unremarkable. Skin: Warm and dry, no rashes noted. Cardiac: Regular rate and rhythm, no murmurs rubs or  gallops. Respiratory: Clear to auscultation bilaterally. Not using accessory muscles, speaking in full sentences. Abdominal: Soft, nontender, nondistended, positive bowel sounds, no masses, no organomegaly. Musculoskeletal: Shoulder, elbow, wrist, hip, knee, ankle stable, and with full range of motion.  Impression and Recommendations:    Wellness examination Assessment & Plan: Routine HCM labs reviewed. HCM reviewed/discussed. Anticipatory guidance regarding healthy weight, lifestyle and choices given. Recommend healthy diet.  Recommend approximately 150 minutes/week of moderate intensity exercise Recommend regular dental and vision exams Always use seatbelt/lap and shoulder restraints Recommend using smoke alarms and checking batteries at least twice a year Recommend using sunscreen when outside Discussed immunization recommendations   Return in about 6 months (around 10/04/2024) for hypertension, diabetes.   ___________________________________________ Miata Culbreth de [place], MD, ABFM, CAQSM Primary Care and Sports Medicine [facility]

## 2024-04-03 NOTE — Patient Instructions (Signed)

## 2024-04-12 ENCOUNTER — Telehealth: Payer: Self-pay | Admitting: Lab

## 2024-04-12 MED ORDER — KETOCONAZOLE 2 % EX CREA: 1.0000 | TOPICAL_CREAM | Freq: Two times a day (BID) | CUTANEOUS | 2 refills | Status: AC

## 2024-04-12 NOTE — Addendum Note (Signed)
 Addended by: CHRISTINE NORLEEN LABOR on: 04/12/2024 03:12 PM   Modules accepted: Orders

## 2024-04-12 NOTE — Telephone Encounter (Signed)
 Patients wife called stating patient was in office last week and the prescription has not been called in yet to [facility] should be listed on file.

## 2024-04-20 ENCOUNTER — Encounter: Payer: Self-pay | Admitting: Family Medicine

## 2024-04-25 ENCOUNTER — Encounter: Payer: Self-pay | Admitting: "Endocrinology

## 2024-04-26 MED ORDER — ACCU-CHEK AVIVA PLUS VI STRP: 1.0000 | ORAL_STRIP | Freq: Three times a day (TID) | 2 refills | Status: AC

## 2024-05-04 NOTE — Assessment & Plan Note (Signed)

## 2024-06-05 ENCOUNTER — Encounter: Payer: Self-pay | Admitting: "Endocrinology

## 2024-06-05 ENCOUNTER — Ambulatory Visit (INDEPENDENT_AMBULATORY_CARE_PROVIDER_SITE_OTHER): Admitting: "Endocrinology

## 2024-06-05 VITALS — BP 110/80 | HR 87 | Ht 70.0 in | Wt 153.0 lb

## 2024-06-05 DIAGNOSIS — R809 Proteinuria, unspecified: Secondary | ICD-10-CM | POA: Diagnosis not present

## 2024-06-05 DIAGNOSIS — Z7984 Long term (current) use of oral hypoglycemic drugs: Secondary | ICD-10-CM | POA: Diagnosis not present

## 2024-06-05 DIAGNOSIS — E7800 Pure hypercholesterolemia, unspecified: Secondary | ICD-10-CM | POA: Diagnosis not present

## 2024-06-05 DIAGNOSIS — E1129 Type 2 diabetes mellitus with other diabetic kidney complication: Secondary | ICD-10-CM | POA: Diagnosis not present

## 2024-06-05 NOTE — Progress Notes (Addendum)
 Outpatient Endocrinology Note Alexander Birmingham, MD  06/05/24   Alexander Peterson 1986/05/17 [id number]  Referring Provider: de [place], Raymond J, MD Primary Care Provider: de [place], Raymond J, MD Reason for consultation: Subjective   Assessment & Plan  Diagnoses and all orders for this visit:  Controlled type 2 diabetes mellitus with microalbuminuria, without long-term current use of insulin (HCC)  Long term (current) use of oral hypoglycemic drugs  Pure hypercholesterolemia   Diabetes Type II complicated by microalbuminuria, No results found for: GFR Hba1c goal less than 7, current Hba1c is  Lab Results  Component Value Date   HGBA1C 7.3 (H) 03/27/2024   Will recommend the following: Metformin 1000 mg bid Farxiga  10 mg every day (reports 20 lbs weight loss after 1 month of use but doesn't want to cut down the dose or stop it to see if it improves weight loss because of his excellent blood sugar at this point. Patent advised to talk to PCP to rule out other etiologies of weight loss, improve protein intake and cut/stop the pill as needed to assess if weight loss improves) Lantus 10 units every day 24 hrs apart if BG >150  03/12/24: referred to podiatry to r/o and treat onychomycosis  CGM denied   No known contraindications/side effects to any of above medications Educated on risks and side effects of SGLT-2 inhibitors including but not limited to yeast infections, urinary tract infections, dehydration, hyperkalemia, euglycemic DKA etc. Patient to notify us  in case of any side effects  -Last LD and Tg are as follows: Lab Results  Component Value Date   LDLCALC 58 03/27/2024    Lab Results  Component Value Date   TRIG 92 03/27/2024   -On simvastatin 40 mg QD -Follow low fat diet and exercise   -Blood pressure goal <140/90 - Microalbumin/creatinine goal is < 30 -Last MA/Cr is as follows: Lab Results  Component Value Date   MICROALBUR 9.3 03/08/2024   -on ACE/ARB  lisinopril 40 mg every day  -diet changes including salt restriction -limit eating outside -counseled BP targets per standards of diabetes care -uncontrolled blood pressure can lead to retinopathy, nephropathy and cardiovascular and atherosclerotic heart disease  Reviewed and counseled on: -A1C target -Blood sugar targets -Complications of uncontrolled diabetes  -Checking blood sugar before meals and bedtime and bring log next visit -All medications with mechanism of action and side effects -Hypoglycemia management: rule of 15's, Glucagon Emergency Kit and medical alert ID -low-carb low-fat plate-method diet -At least 20 minutes of physical activity per day -Annual dilated retinal eye exam and foot exam -compliance and follow up needs -follow up as scheduled or earlier if problem gets worse  Call if blood sugar is less than 70 or consistently above 250    Take a 15 gm snack of carbohydrate at bedtime before you go to sleep if your blood sugar is less than 100.    If you are going to fast after midnight for a test or procedure, ask your physician for instructions on how to reduce/decrease your insulin dose.    Call if blood sugar is less than 70 or consistently above 250  -Treating a low sugar by rule of 15  (15 gms of sugar every 15 min until sugar is more than 70) If you feel your sugar is low, test your sugar to be sure If your sugar is low (less than 70), then take 15 grams of a fast acting Carbohydrate (3-4 glucose tablets or glucose gel or  4 ounces of juice or regular soda) Recheck your sugar 15 min after treating low to make sure it is more than 70 If sugar is still less than 70, treat again with 15 grams of carbohydrate          Don't drive the hour of hypoglycemia  If unconscious/unable to eat or drink by mouth, use glucagon injection or nasal spray baqsimi and call 911. Can repeat again in 15 min if still unconscious.  Return in about 6 months (around 12/03/2024).   I  have reviewed current medications, nurse's notes, allergies, vital signs, past medical and surgical history, family medical history, and social history for this encounter. Counseled patient on symptoms, examination findings, lab findings, imaging results, treatment decisions and monitoring and prognosis. The patient understood the recommendations and agrees with the treatment plan. All questions regarding treatment plan were fully answered.  Alexander Birmingham, MD  06/05/24   History of Present Illness Alexander Peterson is a 83 y.o. year old male who presents for follow up of Type II diabetes mellitus.  Alexander Peterson was first diagnosed in 2027.    Home diabetes regimen: Metformin 1000 mg bid Farxiga  10 mg qd  Stopped Lantus 10 units qam   COMPLICATIONS -  MI/Stroke +  retinopathy -  neuropathy -  nephropathy/+ microalbuminuria   07/2023 MA/Cr 49  BLOOD SUGAR DATA Checks fasting and bedtime Did not bring meter Per recall 85-180  Physical Exam  BP 110/80   Pulse 87   Ht 5' 10 (1.778 m)   Wt 153 lb (69.4 kg)   SpO2 96%   BMI 21.95 kg/m    Constitutional: well developed, well nourished Head: normocephalic, atraumatic Eyes: sclera anicteric, no redness Neck: supple Lungs: normal respiratory effort Neurology: alert and oriented Skin: dry, no appreciable rashes Musculoskeletal: no appreciable defects Psychiatric: normal mood and affect Diabetic Foot Exam - Simple   No data filed      Current Medications Patient's Medications  New Prescriptions   No medications on file  Previous Medications   ACCU-CHEK AVIVA PLUS TEST STRIP    1 each by Other route 3 (three) times daily.   CALCIUM CARB-CHOLECALCIFEROL (CALCIUM 600+D3) 600-200 MG-UNIT TABS    Take 1 tablet by mouth 2 (two) times daily. With lunch and dinner   CYANOCOBALAMIN (VITAMIN B12) 500 MCG TABLET    Take 2,500 mcg by mouth daily. Pt. Takes a half once daily.   DAPAGLIFLOZIN  PROPANEDIOL (FARXIGA ) 10 MG TABS TABLET     Take 1 tablet (10 mg total) by mouth daily before breakfast.   FERROUS SULFATE 325 (65 FE) MG TABLET    Take 325 mg by mouth 3 (three) times a week.   GLUCOSAMINE-CHONDROITIN 500-400 MG TABLET    Take 1 tablet by mouth once.   KETOCONAZOLE  (NIZORAL ) 2 % CREAM    Apply 1 Application topically 2 (two) times daily.   LISINOPRIL (PRINIVIL,ZESTRIL) 40 MG TABLET    Take 40 mg by mouth daily.   METFORMIN (GLUCOPHAGE) 1000 MG TABLET    Take 1,000 mg by mouth 2 (two) times daily.   MULTIPLE VITAMIN (MULTIVITAMIN) TABLET    Take 1 tablet by mouth daily.   MULTIPLE VITAMINS-MINERALS (ICAPS AREDS 2 PO)    Take by mouth.   PSEUDOEPHEDRINE (SUDAFED) 120 MG 12 HR TABLET    Take 120 mg by mouth 2 (two) times daily.   SIMVASTATIN (ZOCOR) 40 MG TABLET    Take 40 mg by mouth daily.   VITAMIN E  400 UNIT CAPSULE    Take 400 Units by mouth every other day.  Modified Medications   No medications on file  Discontinued Medications   No medications on file    Allergies No Known Allergies  Past Medical History Past Medical History:  Diagnosis Date   Arthritis    Diabetes mellitus without complication (HCC)    Hypercholesteremia    Hypertension    Skin cancer     Past Surgical History Past Surgical History:  Procedure Laterality Date   COLON SURGERY  04/2021   [place], [place]   EYE SURGERY     upper instetine removed  2020    Family History family history includes Cancer in his mother; Heart attack in his mother; Heart disease in his mother; Stroke in his sister.  Social History Social History   Socioeconomic History   Marital status: Married    Spouse name: Not on file   Number of children: 2   Years of education: Not on file   Highest education level: Doctorate  Occupational History   Not on file  Tobacco Use   Smoking status: Never    Passive exposure: Never   Smokeless tobacco: Never  Vaping Use   Vaping status: Never Used  Substance and Sexual Activity   Alcohol use: No   Drug use: No    Sexual activity: Not Currently    Partners: Female    Comment: MARRIED  Other Topics Concern   Not on file  Social History Narrative   Retired Surveyor, minerals   Social Drivers of Health   Financial Resource Strain: Low Risk  (11/22/2023)   Overall Financial Resource Strain (CARDIA)    Difficulty of Paying Living Expenses: Not hard at all  Food Insecurity: No Food Insecurity (11/22/2023)   Hunger Vital Sign    Worried About Running Out of Food in the Last Year: Never true    Ran Out of Food in the Last Year: Never true  Transportation Needs: No Transportation Needs (11/22/2023)   PRAPARE - Administrator, Civil Service (Medical): No    Lack of Transportation (Non-Medical): No  Physical Activity: Sufficiently Active (11/22/2023)   Exercise Vital Sign    Days of Exercise per Week: 6 days    Minutes of Exercise per Session: 30 min  Stress: No Stress Concern Present (11/22/2023)   [employer] of Occupational Health - Occupational Stress Questionnaire    Feeling of Stress : Not at all  Social Connections: Moderately Isolated (11/22/2023)   Social Connection and Isolation Panel    Frequency of Communication with Friends and Family: Twice a week    Frequency of Social Gatherings with Friends and Family: Never    Attends Religious Services: More than 4 times per year    Active Member of [employer] or Organizations: No    Attends Banker Meetings: Never    Marital Status: Married  Catering manager Violence: Not At Risk (11/22/2023)   Humiliation, Afraid, Rape, and Kick questionnaire    Fear of Current or Ex-Partner: No    Emotionally Abused: No    Physically Abused: No    Sexually Abused: No    Lab Results  Component Value Date   HGBA1C 7.3 (H) 03/27/2024   HGBA1C 7.4 (A) 02/13/2024   HGBA1C 7.4 04/08/2023   Lab Results  Component Value Date   CHOL 118 03/27/2024   Lab Results  Component Value Date   HDL 42 03/27/2024   Lab Results  Component  Value Date   LDLCALC 58 03/27/2024   Lab Results  Component Value Date   TRIG 92 03/27/2024   Lab Results  Component Value Date   CHOLHDL 2.8 03/27/2024   Lab Results  Component Value Date   CREATININE 1.15 03/27/2024   No results found for: GFR Lab Results  Component Value Date   MICROALBUR 9.3 03/08/2024      Component Value Date/Time   NA 135 03/27/2024 0840   K 4.3 03/27/2024 0840   CL 97 03/27/2024 0840   CO2 21 03/27/2024 0840   GLUCOSE 111 (H) 03/27/2024 0840   GLUCOSE 128 (H) 03/08/2024 0803   BUN 15 03/27/2024 0840   CREATININE 1.15 03/27/2024 0840   CREATININE 1.31 (H) 03/08/2024 0803   CALCIUM 9.2 03/27/2024 0840   PROT 7.0 03/27/2024 0840   ALBUMIN 4.1 03/27/2024 0840   AST 17 03/27/2024 0840   ALT 10 03/27/2024 0840   ALKPHOS 37 (L) 03/27/2024 0840   BILITOT 0.5 03/27/2024 0840   GFRNONAA 73 10/11/2023 1213   GFRAA >60 03/02/2018 1601      Latest Ref Rng & Units 03/27/2024    8:40 AM 03/08/2024    8:03 AM 07/30/2023    9:34 AM  BMP  Glucose 70 - 99 mg/dL 888  871  778   BUN 8 - 27 mg/dL 15  16  11    Creatinine 0.76 - 1.27 mg/dL 8.84  8.68  8.94   BUN/Creat Ratio 10 - 24 13  12     Sodium 134 - 144 mmol/L 135  136  126   Potassium 3.5 - 5.2 mmol/L 4.3  4.5  4.5   Chloride 96 - 106 mmol/L 97  100  92   CO2 20 - 29 mmol/L 21  25  25    Calcium 8.6 - 10.2 mg/dL 9.2  9.9  8.4        Component Value Date/Time   WBC 7.0 03/27/2024 0840   WBC 8.2 07/30/2023 0934   RBC 4.08 (L) 03/27/2024 0840   RBC 4.06 (L) 07/30/2023 0934   HGB 12.6 (L) 03/27/2024 0840   HCT 37.8 03/27/2024 0840   PLT 196 03/27/2024 0840   MCV 93 03/27/2024 0840   MCH 30.9 03/27/2024 0840   MCH 29.3 07/30/2023 0934   MCHC 33.3 03/27/2024 0840   MCHC 32.3 07/30/2023 0934   RDW 12.8 03/27/2024 0840   LYMPHSABS 3.5 (H) 03/27/2024 0840   EOSABS 0.3 03/27/2024 0840   BASOSABS 0.1 03/27/2024 0840     Parts of this note may have been dictated using voice recognition software.  There may be variances in spelling and vocabulary which are unintentional. Not all errors are proofread. Please notify the dino if any discrepancies are noted or if the meaning of any statement is not clear.

## 2024-06-11 ENCOUNTER — Other Ambulatory Visit: Payer: Self-pay | Admitting: "Endocrinology

## 2024-06-12 ENCOUNTER — Ambulatory Visit: Admitting: "Endocrinology

## 2024-06-13 ENCOUNTER — Other Ambulatory Visit (HOSPITAL_BASED_OUTPATIENT_CLINIC_OR_DEPARTMENT_OTHER): Payer: Self-pay

## 2024-06-13 MED ORDER — COMIRNATY 30 MCG/0.3ML IM SUSY
0.3000 mL | PREFILLED_SYRINGE | Freq: Once | INTRAMUSCULAR | 0 refills | Status: AC
Start: 2024-06-13 — End: 2024-06-14
  Filled 2024-06-13: qty 0.3, 1d supply, fill #0

## 2024-07-30 ENCOUNTER — Ambulatory Visit: Payer: Self-pay

## 2024-07-30 NOTE — Telephone Encounter (Signed)
 FYI Only or Action Required?: FYI only for provider: appointment scheduled on 11.5.25.  Patient was last seen in primary care on 04/03/2024 by de [place], Quintin PARAS, MD.  Called Nurse Triage reporting Weight Loss and Mass.  Symptoms began several weeks ago.  Interventions attempted: Nothing.  Symptoms are: right wrist swelling (dime size) with bruising and painful to touch (mild); weight loss (27 pounds since January, started low carb diet) gradually worsening.  Triage Disposition: See Within 3 Days in Office (overriding See Physician Within 24 Hours)  Patient/caregiver understands and will follow disposition?: Yes         Copied from CRM [id number]. Topic: Clinical - Red Word Triage >> Jul 30, 2024  7:49 AM Delon HERO wrote: Red Word that prompted transfer to Nurse Triage: Patient's wife is calling to report a growth on the right arm above the wrist on the outside, that looks infected with pain and swelling.  Wife is also reporting extreme weight loss since January. Reason for Disposition  [1] Swelling is painful to touch AND [2] no fever  Answer Assessment - Initial Assessment Questions 1. APPEARANCE of SWELLING: What does it look like?     Redness, looks bruised.  2. SIZE: How large is the swelling? (e.g., inches, cm; or compare to size of pinhead, tip of pen, eraser, coin, pea, grape, ping pong ball)      Dime sized.  3. LOCATION: Where is the swelling located?     Back or right wrist.  4. ONSET: When did the swelling start?     5 weeks ago.  5. COLOR: What color is it? Is there more than one color?     Redness, bruise, pinkish purplish  6. PAIN: Is there any pain? If Yes, ask: How bad is the pain? (Scale 1-10; or mild, moderate, severe)       Yes painful to touch; 2-3/10.  7. ITCH: Does it itch? If Yes, ask: How bad is the itch?      No.  8. CAUSE: What do you think caused the swelling?     Patient states he noticed it when he was walking  his dog and the dog pulled very hard on the leash.  9 OTHER SYMPTOMS: Do you have any other symptoms? (e.g., fever)     Denies fever, red streaks coming from wound, numbness or weakness. 27 pound weight loss since January and started low carb diet.  Protocols used: Skin Lump or Localized Swelling-A-AH

## 2024-08-01 ENCOUNTER — Ambulatory Visit (INDEPENDENT_AMBULATORY_CARE_PROVIDER_SITE_OTHER): Admitting: Family Medicine

## 2024-08-01 ENCOUNTER — Encounter (HOSPITAL_BASED_OUTPATIENT_CLINIC_OR_DEPARTMENT_OTHER): Payer: Self-pay | Admitting: Family Medicine

## 2024-08-01 VITALS — BP 156/79 | HR 71 | Temp 97.6°F | Resp 18 | Ht 70.0 in | Wt 149.0 lb

## 2024-08-01 DIAGNOSIS — Z794 Long term (current) use of insulin: Secondary | ICD-10-CM

## 2024-08-01 DIAGNOSIS — E1121 Type 2 diabetes mellitus with diabetic nephropathy: Secondary | ICD-10-CM

## 2024-08-01 DIAGNOSIS — R634 Abnormal weight loss: Secondary | ICD-10-CM | POA: Insufficient documentation

## 2024-08-01 DIAGNOSIS — M7989 Other specified soft tissue disorders: Secondary | ICD-10-CM

## 2024-08-01 NOTE — Progress Notes (Signed)
 Acute Care Office Visit  Subjective:   Alexander Peterson Jun 03, 1941 08/01/2024  Chief Complaint  Patient presents with   Wrist Injury    Right wrist. 8 wks ago.    HPI: Discussed the use of AI scribe software for clinical note transcription with the patient, who gave verbal consent to proceed.  History of Present Illness Alexander Peterson is an 84 year old male who presents with a growth on his arm.  He noticed the growth on his arm approximately eight weeks ago. It appeared suddenly, and he is unsure of the cause. He recalls a dream where he bumped into something but does not remember any actual trauma to the area. The growth was first noticed when a dog leash slid over it, and it was already a noticeable size at that time.  He denies any previous occurrences of similar growths. The growth is not currently tender to touch, and there is no drainage. Initially, the growth itched, leading him to scratch it, which resulted in a scab formation. No fevers or chills.  WEIGHT LOSS  Patient is accompanied by his wife today for concerns of weight loss. He reports weight loss occurred siginificantly with starting Farxiga  10mg  for T2DM in May 2025 by endocrinology. He states he has had previous episodes of hypoglycemia but has not had episodes when starting on Farxiga . Patient's wife reports he is feeling cold frequently.   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 Patient reports he was found to have a perforation in his intestine that led to bleeding. Denies dark black stools, bright red rectal bleeding, or change in Bowel movements recently. Patient was approx. 175lbs at start of 2025 per chart review. He has been instructed to increase his protein intake with Glucerna shakes per endocrinology and to consider decrease of Farxiga  to 5mg  daily.   Wt Readings from Last 3 Encounters:   08/01/24 149 lb (67.6 kg)  06/05/24 153 lb (69.4 kg)  04/03/24 160 lb 9.6 oz (72.8 kg)   Glucose readings per patient:  Morning Glucose: 119 on average Before supper: 149 on average  Intake:  Breakfast: Protein bar and coffee or oatmeal Lunch: Example: Half hamburger, fries. Chicken, potatoes, vegetable (eats small portions per wife)  Dinner: Protein and veggie, starch    Decreased appetite: no Night sweats: no Dysphagia/odynophagia: no Dental abnormalities: no Chest pain: no Shortness of breath: no Cough: no Nausea: no Vomiting: no Diarrhea: no Abdominal pain: no Blood in stool: no Easy bruising/bleeding: no Jaundice: no Polydipsia/polyuria: no Recent illness: no Depression: no Previous colonoscopy: N/a due to age   The following portions of the patient's history were reviewed and updated as appropriate: past medical history, past surgical history, family history, social history, allergies, medications, and problem list.   Patient Active Problem List   Diagnosis Date Noted   Unintentional weight loss 08/01/2024   Wellness examination 04/03/2024   Dizziness 10/27/2023   Diabetes mellitus (HCC) 10/05/2023   Primary hypertension 10/05/2023   Hyperlipidemia 10/05/2023   Past Medical History:  Diagnosis Date   Arthritis    Diabetes mellitus without complication (HCC)    Hypercholesteremia    Hypertension    Skin cancer    Past Surgical History:  Procedure Laterality Date   COLON SURGERY  04/2021   [place], [place]   EYE SURGERY     upper instetine removed  2020  Family History  Problem Relation Age of Onset   Heart disease Mother        CABG   Heart attack Mother    Cancer Mother    Stroke Sister    Outpatient Medications Prior to Visit  Medication Sig Dispense Refill   ACCU-CHEK AVIVA PLUS test strip 1 each by Other route 3 (three) times daily. 200 each 2   Calcium Carb-Cholecalciferol (CALCIUM 600+D3) 600-200 MG-UNIT TABS Take 1 tablet by mouth 2 (two)  times daily. With lunch and dinner     cyanocobalamin (VITAMIN B12) 500 MCG tablet Take 2,500 mcg by mouth daily. Pt. Takes a half once daily.     FARXIGA  10 MG TABS tablet TAKE 1 TABLET(10 MG) BY MOUTH DAILY BEFORE BREAKFAST 90 tablet 1   ferrous sulfate 325 (65 FE) MG tablet Take 325 mg by mouth 3 (three) times a week.     glucosamine-chondroitin 500-400 MG tablet Take 1 tablet by mouth once.     LANTUS 100 UNIT/ML injection SMARTSIG:30 Unit(s) SUB-Q Daily     lisinopril (PRINIVIL,ZESTRIL) 40 MG tablet Take 40 mg by mouth daily.  0   metFORMIN (GLUCOPHAGE) 1000 MG tablet Take 1,000 mg by mouth 2 (two) times daily.  0   Multiple Vitamin (MULTIVITAMIN) tablet Take 1 tablet by mouth daily.     Multiple Vitamins-Minerals (ICAPS AREDS 2 PO) Take by mouth.     pseudoephedrine (SUDAFED) 120 MG 12 hr tablet Take 120 mg by mouth 2 (two) times daily.     simvastatin (ZOCOR) 40 MG tablet Take 40 mg by mouth daily.  0   vitamin E 400 UNIT capsule Take 400 Units by mouth every other day.     No facility-administered medications prior to visit.   No Known Allergies   ROS: A complete ROS was performed with pertinent positives/negatives noted in the HPI. The remainder of the ROS are negative.    Objective:   Today's Vitals   08/01/24 1036  BP: (!) 156/79  Pulse: 71  Resp: 18  Temp: 97.6 F (36.4 C)  TempSrc: Oral  SpO2: 99%  Weight: 149 lb (67.6 kg)  Height: 5' 10 (1.778 m)  PainSc: 0-No pain    GENERAL: Well-appearing, in NAD. Well nourished.  SKIN: Pink, warm and dry. Soft tissue growth with mild erythema present to right wrist with possible vascular component. No drainage or bleeding.  Head: Normocephalic. NECK: Trachea midline. Full ROM w/o pain or tenderness. No thyromegaly or nodules.  RESPIRATORY: Chest wall symmetrical. Respirations even and non-labored. Breath sounds clear to auscultation bilaterally.  CARDIAC: S1, S2 present, regular rate and rhythm without murmur or gallops.  Peripheral pulses 2+ bilaterally.  MSK: Muscle tone and strength appropriate for age. Joints w/o tenderness, redness, or swelling.  GI: Abdomen soft, non-tender. Normoactive bowel sounds. No rebound tenderness. No hepatomegaly or splenomegaly. No CVA tenderness. No palpables masses.  EXTREMITIES: Without clubbing, cyanosis, or edema.  NEUROLOGIC: No motor or sensory deficits. Steady, even gait. C2-C12 intact.  PSYCH/MENTAL STATUS: Alert, oriented x 3. Cooperative, appropriate mood and affect.        Assessment & Plan:  1. Soft tissue mass (Primary) Possible pyogenic granuloma given appearance. Will place referral to Dermatology. Patient states he will stop by his dermatologist (Dr. Dela) on way home to see if he can be worked in. No signs of acute infection.  - Ambulatory referral to Dermatology  2. Unintentional weight loss Discussed possibly related due to Farxiga  use and decreased oral  intake, especially protein. Will increase protein intake with nutritional shakes, lean meats, etc. Will obtain labs today to review other causes of unintentional weight loss. Given hx of GI bleed, if signs of anemia present on results and worsening from baseline, will obtain fecal occult.   - CBC with Differential/Platelet - Comprehensive metabolic panel with GFR - TSH - Hemoglobin A1c - Sed Rate (ESR) - Fecal occult blood, imunochemical; Future  3. Type 2 diabetes mellitus with diabetic nephropathy, with long-term current use of insulin (HCC) Currently controlled with regimen. Discussed decreasing Farxiga  to 5mg  per endocrinology recommendations due to weight loss and decreased appetite. Will trial for 1 week with close monitoring of glucose and notify PCP of results.  - Hemoglobin A1c   No orders of the defined types were placed in this encounter.  Lab Orders         Fecal occult blood, imunochemical         CBC with Differential/Platelet         Comprehensive metabolic panel with GFR          TSH         Hemoglobin A1c         Sed Rate (ESR)      Return for As scheduled in January 2026.    Patient to reach out to office if new, worrisome, or unresolved symptoms arise or if no improvement in patient's condition. Patient verbalized understanding and is agreeable to treatment plan. All questions answered to patient's satisfaction.    Thersia Schuyler Stark, [place]

## 2024-08-02 LAB — CBC WITH DIFFERENTIAL/PLATELET
Basophils Absolute: 0.1 x10E3/uL (ref 0.0–0.2)
Basos: 1 %
EOS (ABSOLUTE): 0.3 x10E3/uL (ref 0.0–0.4)
Eos: 3 %
Hematocrit: 38.9 % (ref 37.5–51.0)
Hemoglobin: 12.7 g/dL — ABNORMAL LOW (ref 13.0–17.7)
Immature Grans (Abs): 0 x10E3/uL (ref 0.0–0.1)
Immature Granulocytes: 0 %
Lymphocytes Absolute: 3.8 x10E3/uL — ABNORMAL HIGH (ref 0.7–3.1)
Lymphs: 44 %
MCH: 30 pg (ref 26.6–33.0)
MCHC: 32.6 g/dL (ref 31.5–35.7)
MCV: 92 fL (ref 79–97)
Monocytes Absolute: 0.5 x10E3/uL (ref 0.1–0.9)
Monocytes: 5 %
Neutrophils Absolute: 4 x10E3/uL (ref 1.4–7.0)
Neutrophils: 47 %
Platelets: 203 x10E3/uL (ref 150–450)
RBC: 4.24 x10E6/uL (ref 4.14–5.80)
RDW: 12.4 % (ref 11.6–15.4)
WBC: 8.7 x10E3/uL (ref 3.4–10.8)

## 2024-08-02 LAB — HEMOGLOBIN A1C
Est. average glucose Bld gHb Est-mCnc: 180 mg/dL
Hgb A1c MFr Bld: 7.9 % — ABNORMAL HIGH (ref 4.8–5.6)

## 2024-08-02 LAB — COMPREHENSIVE METABOLIC PANEL WITH GFR
ALT: 10 IU/L (ref 0–44)
AST: 19 IU/L (ref 0–40)
Albumin: 4.3 g/dL (ref 3.7–4.7)
Alkaline Phosphatase: 66 IU/L (ref 48–129)
BUN/Creatinine Ratio: 24 (ref 10–24)
BUN: 26 mg/dL (ref 8–27)
Bilirubin Total: 0.4 mg/dL (ref 0.0–1.2)
CO2: 23 mmol/L (ref 20–29)
Calcium: 9.3 mg/dL (ref 8.6–10.2)
Chloride: 93 mmol/L — ABNORMAL LOW (ref 96–106)
Creatinine, Ser: 1.1 mg/dL (ref 0.76–1.27)
Globulin, Total: 3.2 g/dL (ref 1.5–4.5)
Glucose: 164 mg/dL — ABNORMAL HIGH (ref 70–99)
Potassium: 4.7 mmol/L (ref 3.5–5.2)
Sodium: 131 mmol/L — ABNORMAL LOW (ref 134–144)
Total Protein: 7.5 g/dL (ref 6.0–8.5)
eGFR: 66 mL/min/1.73 (ref >59)

## 2024-08-02 LAB — SEDIMENTATION RATE: Sed Rate: 15 mm/h (ref 0–30)

## 2024-08-02 LAB — TSH: TSH: 0.687 u[IU]/mL (ref 0.450–4.500)

## 2024-08-03 ENCOUNTER — Ambulatory Visit (HOSPITAL_BASED_OUTPATIENT_CLINIC_OR_DEPARTMENT_OTHER): Payer: Self-pay | Admitting: Family Medicine

## 2024-08-03 DIAGNOSIS — E871 Hypo-osmolality and hyponatremia: Secondary | ICD-10-CM

## 2024-08-03 NOTE — Progress Notes (Signed)
 Please schedule repeat lab visit BMP in 1 to 2 weeks.

## 2024-08-14 ENCOUNTER — Encounter: Payer: Self-pay | Admitting: "Endocrinology

## 2024-08-21 ENCOUNTER — Ambulatory Visit (HOSPITAL_BASED_OUTPATIENT_CLINIC_OR_DEPARTMENT_OTHER)

## 2024-08-21 DIAGNOSIS — E871 Hypo-osmolality and hyponatremia: Secondary | ICD-10-CM

## 2024-08-21 LAB — BASIC METABOLIC PANEL WITH GFR
BUN/Creatinine Ratio: 19 (ref 10–24)
BUN: 22 mg/dL (ref 8–27)
CO2: 22 mmol/L (ref 20–29)
Calcium: 9.7 mg/dL (ref 8.6–10.2)
Chloride: 96 mmol/L (ref 96–106)
Creatinine, Ser: 1.13 mg/dL (ref 0.76–1.27)
Glucose: 111 mg/dL — ABNORMAL HIGH (ref 70–99)
Potassium: 5.5 mmol/L — ABNORMAL HIGH (ref 3.5–5.2)
Sodium: 133 mmol/L — ABNORMAL LOW (ref 134–144)
eGFR: 64 mL/min/1.73 (ref >59)

## 2024-08-26 ENCOUNTER — Ambulatory Visit (HOSPITAL_BASED_OUTPATIENT_CLINIC_OR_DEPARTMENT_OTHER): Payer: Self-pay | Admitting: Family Medicine

## 2024-08-26 DIAGNOSIS — E871 Hypo-osmolality and hyponatremia: Secondary | ICD-10-CM

## 2024-08-26 NOTE — Progress Notes (Signed)
 Please let patieknt know his sodium had improved, but his potassium has increased to a higher than normal range. I recommend decreasing electrolyte drink replacement daily and maybe use this 1-2x a week. Have him increase clear fluid intake and recommend rechecking Bmp this week.

## 2024-09-22 ENCOUNTER — Encounter (HOSPITAL_BASED_OUTPATIENT_CLINIC_OR_DEPARTMENT_OTHER): Payer: Self-pay | Admitting: Family Medicine

## 2024-09-22 DIAGNOSIS — E785 Hyperlipidemia, unspecified: Secondary | ICD-10-CM

## 2024-09-22 DIAGNOSIS — E1121 Type 2 diabetes mellitus with diabetic nephropathy: Secondary | ICD-10-CM

## 2024-09-22 DIAGNOSIS — I10 Essential (primary) hypertension: Secondary | ICD-10-CM

## 2024-09-24 MED ORDER — SIMVASTATIN 40 MG PO TABS: 40.0000 mg | ORAL_TABLET | Freq: Every day | ORAL | 1 refills | Status: AC

## 2024-09-24 MED ORDER — LISINOPRIL 40 MG PO TABS: 40.0000 mg | ORAL_TABLET | Freq: Every day | ORAL | 1 refills | Status: AC

## 2024-09-24 MED ORDER — METFORMIN HCL 1000 MG PO TABS: 1000.0000 mg | ORAL_TABLET | Freq: Two times a day (BID) | ORAL | 1 refills | Status: AC

## 2024-10-04 ENCOUNTER — Ambulatory Visit (INDEPENDENT_AMBULATORY_CARE_PROVIDER_SITE_OTHER): Admitting: Family Medicine

## 2024-10-04 ENCOUNTER — Encounter (HOSPITAL_BASED_OUTPATIENT_CLINIC_OR_DEPARTMENT_OTHER): Payer: Self-pay | Admitting: Family Medicine

## 2024-10-04 VITALS — BP 136/86 | HR 82 | Temp 97.5°F | Resp 18 | Ht 70.0 in | Wt 147.6 lb

## 2024-10-04 DIAGNOSIS — Z794 Long term (current) use of insulin: Secondary | ICD-10-CM | POA: Diagnosis not present

## 2024-10-04 DIAGNOSIS — I10 Essential (primary) hypertension: Secondary | ICD-10-CM

## 2024-10-04 DIAGNOSIS — J309 Allergic rhinitis, unspecified: Secondary | ICD-10-CM | POA: Diagnosis not present

## 2024-10-04 DIAGNOSIS — E875 Hyperkalemia: Secondary | ICD-10-CM | POA: Diagnosis not present

## 2024-10-04 DIAGNOSIS — E1121 Type 2 diabetes mellitus with diabetic nephropathy: Secondary | ICD-10-CM

## 2024-10-04 LAB — BASIC METABOLIC PANEL WITH GFR
BUN/Creatinine Ratio: 23 (ref 10–24)
BUN: 28 mg/dL — ABNORMAL HIGH (ref 8–27)
CO2: 23 mmol/L (ref 20–29)
Calcium: 9.5 mg/dL (ref 8.6–10.2)
Chloride: 95 mmol/L — ABNORMAL LOW (ref 96–106)
Creatinine, Ser: 1.23 mg/dL (ref 0.76–1.27)
Glucose: 257 mg/dL — ABNORMAL HIGH (ref 70–99)
Potassium: 5.7 mmol/L — ABNORMAL HIGH (ref 3.5–5.2)
Sodium: 133 mmol/L — ABNORMAL LOW (ref 134–144)
eGFR: 58 mL/min/1.73 — ABNORMAL LOW

## 2024-10-04 NOTE — Assessment & Plan Note (Signed)
 Patient continues with Lantus, metformin , Farxiga .  He also follows with endocrinology.  Most recent A1c a couple months ago had slightly worsened and was at 7.9%.  He notes that he has made some adjustments in the timing of his medication, particularly with administering Lantus later in the day and feels that his fasting blood sugars in the morning have improved.  No current issues with hypoglycemic episodes or symptoms. He has follow-up with endocrinology in 2 months, encouraged to continue with this, suspect that they will check A1c at that time, would be too soon to check today Can continue with current medication regimen, no changes today. Plan to check urine ACR and foot exam later in the year

## 2024-10-04 NOTE — Progress Notes (Signed)
" ° ° °  Procedures performed today:    None.  Independent interpretation of notes and tests performed by another provider:   None.  Brief History, Exam, Impression, and Recommendations:    BP (!) 141/80 (BP Location: Left Arm, Patient Position: Sitting, Cuff Size: Normal)   Pulse 89   Temp (!) 97.5 F (36.4 C) (Oral)   Resp 18   Ht 5' 10 (1.778 m)   Wt 147 lb 9.6 oz (67 kg)   SpO2 100%   BMI 21.18 kg/m   Type 2 diabetes mellitus with diabetic nephropathy, with long-term current use of insulin (HCC) Assessment & Plan: Patient continues with Lantus, metformin , Farxiga .  He also follows with endocrinology.  Most recent A1c a couple months ago had slightly worsened and was at 7.9%.  He notes that he has made some adjustments in the timing of his medication, particularly with administering Lantus later in the day and feels that his fasting blood sugars in the morning have improved.  No current issues with hypoglycemic episodes or symptoms. He has follow-up with endocrinology in 2 months, encouraged to continue with this, suspect that they will check A1c at that time, would be too soon to check today Can continue with current medication regimen, no changes today. Plan to check urine ACR and foot exam later in the year   Primary hypertension Assessment & Plan: Blood pressure borderline in office today, can continue with current regimen and we can monitor blood pressure moving forward.  Recommend intermittent monitoring of blood pressure at home, DASH diet.  Orders: -     Basic metabolic panel with GFR  Hyperkalemia Assessment & Plan: Noted on recent labs, was recommended to return sooner to have repeat labs completed, however never did return.  We will check labs today to follow-up on electrolytes  Orders: -     Basic metabolic panel with GFR  Allergic rhinitis, unspecified seasonality, unspecified trigger Assessment & Plan: Reports symptoms of allergic rhinitis, some postnasal  drip.  Does utilize Flonase  as well as nasal saline spray, has generally been using Zyrtec.  No current issues with fever. Discussed considerations, can continue with conservative measures, also discussed possibly switching to alternative oral antihistamine and assessing response.   Return in about 4 months (around 02/01/2025) for diabetes, hypertension.   ___________________________________________ Terri Rorrer de [place], MD, ABFM, CAQSM [facility] "

## 2024-10-04 NOTE — Assessment & Plan Note (Signed)
 Blood pressure borderline in office today, can continue with current regimen and we can monitor blood pressure moving forward.  Recommend intermittent monitoring of blood pressure at home, DASH diet.

## 2024-10-04 NOTE — Assessment & Plan Note (Signed)
 Noted on recent labs, was recommended to return sooner to have repeat labs completed, however never did return.  We will check labs today to follow-up on electrolytes

## 2024-10-04 NOTE — Assessment & Plan Note (Signed)
 Reports symptoms of allergic rhinitis, some postnasal drip.  Does utilize Flonase  as well as nasal saline spray, has generally been using Zyrtec.  No current issues with fever. Discussed considerations, can continue with conservative measures, also discussed possibly switching to alternative oral antihistamine and assessing response.

## 2024-10-08 ENCOUNTER — Ambulatory Visit (HOSPITAL_BASED_OUTPATIENT_CLINIC_OR_DEPARTMENT_OTHER): Payer: Self-pay | Admitting: Family Medicine

## 2024-10-21 ENCOUNTER — Encounter (HOSPITAL_BASED_OUTPATIENT_CLINIC_OR_DEPARTMENT_OTHER): Payer: Self-pay | Admitting: Family Medicine

## 2024-12-03 ENCOUNTER — Ambulatory Visit: Admitting: "Endocrinology

## 2025-03-04 ENCOUNTER — Ambulatory Visit (HOSPITAL_BASED_OUTPATIENT_CLINIC_OR_DEPARTMENT_OTHER): Admitting: Family Medicine
